# Patient Record
Sex: Female | Born: 1985 | Race: White | Hispanic: No | Marital: Single | Smoking: Never smoker
Health system: Southern US, Community
[De-identification: ages and names within clinical notes are randomized; demographics above are authoritative.]

## PROBLEM LIST (undated history)

## (undated) DIAGNOSIS — F32A Depression, unspecified: Secondary | ICD-10-CM

## (undated) DIAGNOSIS — R87629 Unspecified abnormal cytological findings in specimens from vagina: Secondary | ICD-10-CM

## (undated) DIAGNOSIS — N83209 Unspecified ovarian cyst, unspecified side: Secondary | ICD-10-CM

## (undated) DIAGNOSIS — B977 Papillomavirus as the cause of diseases classified elsewhere: Secondary | ICD-10-CM

## (undated) DIAGNOSIS — E7800 Pure hypercholesterolemia, unspecified: Secondary | ICD-10-CM

## (undated) DIAGNOSIS — I509 Heart failure, unspecified: Secondary | ICD-10-CM

## (undated) DIAGNOSIS — F319 Bipolar disorder, unspecified: Secondary | ICD-10-CM

## (undated) DIAGNOSIS — E119 Type 2 diabetes mellitus without complications: Secondary | ICD-10-CM

## (undated) DIAGNOSIS — F329 Major depressive disorder, single episode, unspecified: Secondary | ICD-10-CM

## (undated) DIAGNOSIS — K219 Gastro-esophageal reflux disease without esophagitis: Secondary | ICD-10-CM

## (undated) DIAGNOSIS — F419 Anxiety disorder, unspecified: Secondary | ICD-10-CM

## (undated) DIAGNOSIS — I10 Essential (primary) hypertension: Secondary | ICD-10-CM

## (undated) HISTORY — DX: Unspecified abnormal cytological findings in specimens from vagina: R87.629

## (undated) HISTORY — PX: SALPINGECTOMY: SHX328

## (undated) HISTORY — DX: Unspecified ovarian cyst, unspecified side: N83.209

## (undated) HISTORY — DX: Pure hypercholesterolemia, unspecified: E78.00

## (undated) HISTORY — DX: Heart failure, unspecified: I50.9

## (undated) HISTORY — DX: Papillomavirus as the cause of diseases classified elsewhere: B97.7

## (undated) HISTORY — PX: CHOLECYSTECTOMY: SHX55

---

## 2003-09-06 ENCOUNTER — Encounter: Payer: Self-pay | Admitting: Obstetrics & Gynecology

## 2003-09-06 ENCOUNTER — Encounter: Payer: Self-pay | Admitting: Emergency Medicine

## 2003-09-06 ENCOUNTER — Inpatient Hospital Stay (HOSPITAL_COMMUNITY): Admission: AD | Admit: 2003-09-06 | Discharge: 2003-09-17 | Payer: Self-pay | Admitting: Obstetrics & Gynecology

## 2003-09-11 ENCOUNTER — Encounter: Payer: Self-pay | Admitting: Obstetrics and Gynecology

## 2003-10-04 ENCOUNTER — Inpatient Hospital Stay (HOSPITAL_COMMUNITY): Admission: AD | Admit: 2003-10-04 | Discharge: 2003-10-08 | Payer: Self-pay | Admitting: Obstetrics & Gynecology

## 2009-01-24 ENCOUNTER — Emergency Department (HOSPITAL_COMMUNITY): Admission: EM | Admit: 2009-01-24 | Discharge: 2009-01-24 | Payer: Self-pay | Admitting: Emergency Medicine

## 2011-04-24 NOTE — H&P (Signed)
NAMEMarland Friedman  [name], [name]                           ACCOUNT NO.:  [id number]   MEDICAL RECORD NO.:  [id number]                   PATIENT TYPE:  EMS   LOCATION:  MAJO                                 FACILITY:  [facility]   PHYSICIAN:  Abigail Miyamoto, M.D.              DATE OF BIRTH:  October 15, 1986   DATE OF ADMISSION:  09/06/2003  DATE OF DISCHARGE:                                HISTORY & PHYSICAL   CHIEF COMPLAINT:  Abdominal pain, known gallstones, seven months pregnancy.   HISTORY OF PRESENT ILLNESS:  This is an 25 year old female who presents to  the emergency department down here with a several day history of right upper  quadrant abdominal pain.  She has been seen at [facility] in [place],  and was already known to be seven months pregnant.  She was found there to  have impacted gallstone, and was placed on antibiotics and pain medication.  Without improvement, the family decided to forego further care there and  come to [facility] for care.  Therefore, she presented to [facility].  She reports she has for almost a week had right upper quadrant  abdominal pain, nausea, but no emesis.  She denies any jaundice with this.  She had no chest pain or shortness of breath, and questionable fevers.  She  has only been recently found to be seven months pregnant.  She has had no  relief with the course of antibiotics she got at [facility], and with  Percocet.   PAST MEDICAL HISTORY:  Negative.   PAST SURGICAL HISTORY:  Tonsillectomy.   MEDICATIONS:  None.   ALLERGIES:  No known drug allergies.   SOCIAL HISTORY:  She does not smoke, does not drink alcohol.   PHYSICAL EXAMINATION:  GENERAL:  A well-developed, well-nourished female in  no acute distress.  VITAL SIGNS:  Blood pressure is 130/87, temperature 98.7, respiratory rate  18, heart rate 107.  HEENT:  Eyes - she is anicteric.  Pupils are reactive bilaterally.  Ears,  nose, mouth, and throat - external ears and  nose are normal.  Oropharynx is  clear.  NECK:  Supple.  There is no cervical adenopathy or thyromegaly.  LUNGS:  Clear to auscultation bilaterally with normal effort.  CARDIOVASCULAR:  Regular rate and rhythm with no murmurs.  There is no  peripheral edema.  ABDOMEN:  Gravid.  There is mild tenderness with guarding in the right upper  quadrant.  There are no hernias.  There is no organomegaly.  EXTREMITIES:  Warm and well perfused.   LABORATORY DATA:  The patient has a normal bilirubin at 0.9, normal alkaline  phosphatase at 72, and the rest of her liver function tests are normal.  Amylase and lipase are normal.  White blood cell count is 11.2 with 63%  neutrophils.  Hemoglobin is 11.2.   X-RAY DATA:  The patient has an ultrasound showing her to  have impacted  gallstone in the neck, and a mildly thick wall.  There is no free fluid.  No  pericholecystic fluid, and a normal-sized common bile duct.   IMPRESSION:  The patient with symptomatic cholelithiasis, possible mild  cholecystitis, and impacted gallstone, who is also seven months pregnant.  At this point, she would not be able to undergo laparoscopic  cholecystectomy, given the duration of her pregnancy.  All I can offer right  now would be open cholecystectomy or percutaneous cholecystostomy.  At this  point, we will admit her to the hospital, place her on IV antibiotics, and  try conservative management to get over this episode to see if we can indeed  proceed with her eventual delivery and then laparoscopic cholecystectomy  after this.  If she does worsen, we will proceed with open cholecystectomy.  We will admit her to [facility], and will have the OB/GYN unassigned  on call evaluate Ms. Licea.  I discussed this with Dr. Seymour Bars, on call for  [facility].  She will be transferred from [facility] to [facility].                                               Abigail Miyamoto, M.D.    DB/MEDQ  D:  09/06/2003  T:  09/06/2003   Job:  [id number]

## 2011-04-24 NOTE — Discharge Summary (Signed)
NAMETHATIANA, [name]                           ACCOUNT NO.:  [id number]   MEDICAL RECORD NO.:  [id number]                   PATIENT TYPE:  INP   LOCATION:  9105                                 [facility]ACILITY:  [facility]   PHYSICIAN:  Franchot Mimes, MD                   DATE OF BIRTH:  [birth year]   DATE OF ADMISSION:  09/06/2003  DATE OF DISCHARGE:  09/17/2003                                 DISCHARGE SUMMARY   DISCHARGE DIAGNOSES:  1. Intrauterine pregnancy.  2. Symptomatic cholelithiasis.   HISTORY OF PRESENT ILLNESS:  [name] is an 25 year old, G1, at 35 weeks and  6 days at the time of discharge, who was transferred to the [facility] teaching  service on September 12, 2003.  Please refer to prior history and physical for  details of hospital course prior to September 12, 2003.   HOSPITAL COURSE:  She was admitted for severe right upper quadrant pain,  nausea, and vomiting secondary to impacted gallstones that were revealed by  ultrasound at the time of admission.  At the time when care was undertaken  by the teaching service, the patient was tolerating a regular diet.  Her  pain was well controlled on Dilaudid 1 mg p.o. q.3h. and she was on Unasyn 3  g IV q.6h. for antibiotics.   During the remainder of the hospital course, the patient remained afebrile  and tolerated p.o. without complication.  Her pain medicine was switched to  Percocet 5 mg q.4-6h. because she felt the Dilaudid was not adequate for  pain control and her antibiotics were switched to Augmentin 875 mg prior to  discharge.   The patient received betamethasone injections x 2 on her admission.   Given that the patient was on entirely p.o. medicine, was not receiving any  IV fluids, and was tolerating a regular diet without complication, it was  determined that she was okay for discharge home.  I discussed this with the  surgical consult, who agreed.  The patient will be scheduled for followup  appointment in one week.   DISCHARGE  MEDICATIONS:  1. Ibuprofen 600 mg every six to eight hours for moderate pain.  2. Percocet 5/325 mg every four to six hours for severe pain.  3. Colace 100 mg p.o. b.i.d. for constipation.  4. Niferex 150 mg p.o. b.i.d.  5. Augmentin 875 mg p.o. b.i.d. for seven days.  6. Ambien 10 mg q.h.s. p.r.n. insomnia.  7. Prenatal vitamins one tablet per day.   DISCHARGE DESTINATION:  The patient was discharged home.   DIET:  The patient was instructed to eat a low-fat diet.   ACTIVITY:  To restrict her activity with no heavy lifting and no strenuous  activity and to not have any intercourse or things that would make her  organism.    FOLLOWUP APPOINTMENTS:  1. The patient is scheduled for a followup appointment  at the [facility] on Tuesday, September 25, 2003.  She will be called for     the time of the appointment.  2. The patient was given the number for Dr. Eliberto Ivory office at the time of     discharge.  She is to call if she needs an appointment before two weeks,     otherwise she is to call to schedule an appointment in approximately two     weeks for followup.                                               Franchot Mimes, MD    TV/MEDQ  D:  09/17/2003  T:  09/17/2003  Job:  [id number]   cc:   Abigail Miyamoto, M.D.  [address]  [place]  [place] [address]  Fax: [phone]

## 2011-04-24 NOTE — Consult Note (Signed)
   [name], [name]                           ACCOUNT NO.:  [id number]   MEDICAL RECORD NO.:  [id number]                   PATIENT TYPE:  INP   LOCATION:  9373                                 [facility]ILITY[facility]   PHYSICIAN:  Lenoard Aden, M.D.             DATE OF BIRTH:  07-08-1986   DATE OF CONSULTATION:  09/06/2003  DATE OF DISCHARGE:                                   CONSULTATION   CHIEF COMPLAINT:  Abdominal pain.   HISTORY OF PRESENT ILLNESS:  The patient is an 25 year old female at 45  weeks estimated gestational age who presents with impacted gallstones and  possible acute cholecystitis.  She presented to [facility], and was  evaluated by surgical, Dr. Magnus Ivan, and was sent to [facility] for  surveillance pending a surgical evaluation and triage.  She denies emesis,  shortness of breath, or any evidence of jaundice.  She has had no history of  previous abdominal pain.   PAST MEDICAL HISTORY:  Noncontributory.   SOCIAL HISTORY:  Noncontributory.   MEDICAL HISTORY:  Remarkable for tonsillectomy.   MEDICATIONS:  Takes no medications.   ALLERGIES:  No known drug allergies.   SOCIAL HISTORY:  Denies domestic or physical violence.   PHYSICAL EXAMINATION:  GENERAL:  She is a well-developed, well-nourished,  white female in no acute distress.  VITAL SIGNS:  Blood pressure 130/87, temperature 96, respirations 18, pulse  100.  HEENT:  Normal.  LUNGS:  Clear.  HEART:  Regular rhythm.  ABDOMEN:  Soft.  Mildly tender to palpation.  No rebound or guarding noted.  EXTREMITIES:  No __________.  NEUROLOGIC:  Nonfocal.  PELVIC:  Deferred.   LABORATORY DATA:  WBC of 11.2, hemoglobin 11.2.  Liver function tests within  normal limits.  Ultrasound, AFI, and BPP are pending.  Fetal heart rate in  the 140 to 150 beat per minute range with fetal accelerations noted.  No  decelerations noted.  Contractions not monitored at this time.   IMPRESSION:  Acute cholecystitis at 31  weeks intrauterine gestation.    PLAN:  Expectant versus surgical management to be determined by general  surgery.  Will monitor with NST's b.i.d.  Check ultrasound, AFI, and  estimated fetal weight.  Will triage surveillance pending ultrasound  results.  Will then, pending surgical management, follow up per obstetric  care in [facility] per patient request.                                               Lenoard Aden, M.D.    RJT/MEDQ  D:  09/06/2003  T:  09/06/2003  Job:  [id number]

## 2017-05-23 ENCOUNTER — Inpatient Hospital Stay (HOSPITAL_COMMUNITY)
Admission: AD | Admit: 2017-05-23 | Discharge: 2017-05-27 | DRG: 885 | Disposition: A | Discharge status: Home / Self Care | Payer: Medicaid Other | Source: Intra-hospital | Attending: Psychiatry | Admitting: Psychiatry

## 2017-05-23 ENCOUNTER — Encounter (HOSPITAL_COMMUNITY): Payer: Self-pay

## 2017-05-23 DIAGNOSIS — R45851 Suicidal ideations: Secondary | ICD-10-CM | POA: Diagnosis not present

## 2017-05-23 DIAGNOSIS — F332 Major depressive disorder, recurrent severe without psychotic features: Secondary | ICD-10-CM

## 2017-05-23 DIAGNOSIS — K219 Gastro-esophageal reflux disease without esophagitis: Secondary | ICD-10-CM | POA: Diagnosis not present

## 2017-05-23 DIAGNOSIS — I10 Essential (primary) hypertension: Secondary | ICD-10-CM | POA: Diagnosis not present

## 2017-05-23 DIAGNOSIS — E1165 Type 2 diabetes mellitus with hyperglycemia: Secondary | ICD-10-CM | POA: Diagnosis not present

## 2017-05-23 DIAGNOSIS — Z818 Family history of other mental and behavioral disorders: Secondary | ICD-10-CM | POA: Diagnosis not present

## 2017-05-23 DIAGNOSIS — R002 Palpitations: Secondary | ICD-10-CM | POA: Diagnosis not present

## 2017-05-23 DIAGNOSIS — Z91013 Allergy to seafood: Secondary | ICD-10-CM

## 2017-05-23 HISTORY — DX: Essential (primary) hypertension: I10

## 2017-05-23 HISTORY — DX: Depression, unspecified: F32.A

## 2017-05-23 HISTORY — DX: Major depressive disorder, single episode, unspecified: F32.9

## 2017-05-23 HISTORY — DX: Anxiety disorder, unspecified: F41.9

## 2017-05-23 HISTORY — DX: Bipolar disorder, unspecified: F31.9

## 2017-05-23 HISTORY — DX: Gastro-esophageal reflux disease without esophagitis: K21.9

## 2017-05-23 HISTORY — DX: Major depressive disorder, recurrent severe without psychotic features: F33.2

## 2017-05-23 HISTORY — DX: Type 2 diabetes mellitus without complications: E11.9

## 2017-05-23 LAB — GLUCOSE, CAPILLARY: Glucose-Capillary: 208 mg/dL — ABNORMAL HIGH (ref 65–99)

## 2017-05-23 MED ORDER — ACETAMINOPHEN 325 MG PO TABS: 650.0000 mg | ORAL_TABLET | Freq: Four times a day (QID) | ORAL | Status: DC | PRN

## 2017-05-23 MED ORDER — MAGNESIUM HYDROXIDE 400 MG/5ML PO SUSP: 30.0000 mL | Freq: Every day | ORAL | Status: DC | PRN

## 2017-05-23 MED ORDER — INSULIN ASPART 100 UNIT/ML ~~LOC~~ SOLN
0.0000 [IU] | Freq: Three times a day (TID) | SUBCUTANEOUS | Status: DC
  Administered 2017-05-24: 3 [IU] via SUBCUTANEOUS
  Administered 2017-05-24 (×2): 5 [IU] via SUBCUTANEOUS

## 2017-05-23 MED ORDER — ALUM & MAG HYDROXIDE-SIMETH 200-200-20 MG/5ML PO SUSP: 30.0000 mL | ORAL | Status: DC | PRN

## 2017-05-23 MED ORDER — NYSTATIN 100000 UNIT/GM EX POWD
Freq: Two times a day (BID) | CUTANEOUS | Status: DC
  Filled 2017-05-23: qty 15

## 2017-05-23 NOTE — Tx Team (Signed)
Initial Treatment Plan 05/23/2017 11:35 PM Amy SeveranceKristina Patricia Friedman UJW:[id number]    PATIENT STRESSORS: Health problems Medication change or noncompliance   PATIENT STRENGTHS: Ability for insight Active sense of humor Average or above average intelligence Capable of independent living Motivation for treatment/growth Supportive family/friends   PATIENT IDENTIFIED PROBLEMS:   "Getting back on my medications that worked"  "Resolve thoughts of suicide"  "Have an out-patient plan in place that is reliable"  Suicide Risk             DISCHARGE CRITERIA:  Ability to meet basic life and health needs Adequate post-discharge living arrangements Improved stabilization in mood, thinking, and/or behavior Medical problems require only outpatient monitoring Motivation to continue treatment in Friedman less acute level of care Need for constant or close observation no longer present Reduction of life-threatening or endangering symptoms to within safe limits Safe-care adequate arrangements made Verbal commitment to aftercare and medication compliance  PRELIMINARY DISCHARGE PLAN: Outpatient therapy  PATIENT/FAMILY INVOLVEMENT: This treatment plan has been presented to and reviewed with the patient, [name].  The patient and family have been given the opportunity to ask questions and make suggestions.  Amy LoganAmanda Friedman Amy Hughett, RN 05/23/2017, 11:35 PM

## 2017-05-23 NOTE — BH Assessment (Signed)
Tele Assessment Note Pt was assessed by TTS [name] [facility] 05/22/17:  Pt reports she was diagnosed with depressive symptoms since childhood. She states that for 15 years her psychiatrist prescribed Prozac 80 mg and she was relatively stable. She says her psychiatrist retired and three months ago her new psychiatrist is prescribing a low dose Prozac, gabapentin and hydroxyzine. Pt says she feels sad, angry and emotionally unstable. She acknowledges symptoms including crying spells, social withdrawal, loss of interest in usual pleasures, fatigue, anhedonia, irritability and feelings of hopelessness. Pt reports she is sleeping very little and her appetite is poor. She reports having increased anxiety and daily panic attacks. She says she has not been caring for her grooming as she typically does. She reports recurring suicidal ideation with thoughts of overdosing on medications. Pt reports she has threatened to overdose and has cut herself when she was younger. Pt reports her paternal grandmother and aunt both completed suicide. She says she has been trying to rationalize that her children would be better off without her. She says she is afraid to be alone because she fears she will act on suicidal thought. Pt says she doesn't feel safe returning home at this time. Pt denies current homicidal ideation or history of aggressive behavior. She denies any history of psychotic symptoms. Pt denies any history of alcohol or substance use.  Pt identifies family conflicts as her primary stressor. She says her daughter has been diagnosed with ADHD and ODD. Pt reports she is on disability. She lives with her daughter, her boyfriend and her boyfriend's mother. Pt identifies her boyfriend and mother as primary supports. She says her mother noticed how depressed Pt is and insisted she seek treatment. Pt reports both her parents were diagnosed with bipolar disorder and there is a maternal history of alcohol abuse. Pt denies any  legal problems.  Pt reports she is receiving outpatient mental health treatment through [facility]. She says she is compliant with medications. Pt reports she was psychiatrically hospitalized several times as a child at [facility].   Pt is dressed in a hospital gown. She is alert, oriented x4 with normal speech and motor behavior. Eye contact is good. Pt's mood is depressed and anxious; affect is congruent with mood. Pt's thought process is coherent and relevant. Pt was cooperative throughout assessment. Pt is willing to sign voluntarily into a psychiatric facility.   [name] is an 31 y.o. female.   Diagnosis: Bipolar I Disorder, Current Episode Depressed, Severe without Psychotic Features  Past Medical History: No past medical history on file.  No past surgical history on file.  Family History: No family history on file.  Social History:  has no tobacco, alcohol, and drug history on file.  Additional Social History:  Alcohol / Drug Use Pain Medications: no abuse =- see pta meds list Prescriptions: no abuse - see pta meds list Over the Counter: no abuse - see pta meds list History of alcohol / drug use?: No history of alcohol / drug abuse  CIWA:   COWS:    PATIENT STRENGTHS: (choose at least two) Average or above average intelligence Communication skills Physical Health  Allergies: Allergies no known allergies  Home Medications:  (Not in a hospital admission)  OB/GYN Status:  No LMP recorded.  General Assessment Data Location of Assessment: [facility] TTS Assessment: Out of system Is this a Tele or Face-to-Face Assessment?: Tele Assessment Is this an Initial Assessment or a Re-assessment for this encounter?: Initial Assessment  Marital status: Long term relationship Is patient pregnant?: No Pregnancy Status: No Living Arrangements: Children, Non-relatives/Friends (daughter, pt's boyfriend & boyfriend's mom) Can pt return to current living  arrangement?: Yes Admission Status: Voluntary Is patient capable of signing voluntary admission?: Yes Referral Source: Self/Family/Friend Insurance type[place] [place]     Crisis Care Plan Living Arrangements: Children, Non-relatives/Friends (daughter, pt's boyfriend & boyfriend's mom) Name of Psychiatrist: [facility] Name of Therapist: [facility]  Education Status Is patient currently in school?: No  Risk to self with the past 6 months Suicidal Ideation: Yes-Currently Present Has patient been a risk to self within the past 6 months prior to admission? : No Suicidal Intent: No Has patient had any suicidal intent within the past 6 months prior to admission? : No Is patient at risk for suicide?: Yes Suicidal Plan?: Yes-Currently Present Has patient had any suicidal plan within the past 6 months prior to admission? : No Specify Current Suicidal Plan: overdose on her meds Access to Means: Yes What has been your use of drugs/alcohol within the last 12 months?: none Previous Attempts/Gestures:  (pt threatened suicide several times when a child) Other Self Harm Risks: none Family Suicide History: Yes (paternal grandmom & aunt completed suicides) Recent stressful life event(s):  (conflict w/ family, daughter has ADHD & ODD, on disability) Persecutory voices/beliefs?: No Depression: Yes Depression Symptoms: Isolating, Fatigue, Tearfulness, Loss of interest in usual pleasures, Despondent, Feeling angry/irritable Substance abuse history and/or treatment for substance abuse?: No Suicide prevention information given to non-admitted patients: Not applicable  Risk to Others within the past 6 months Homicidal Ideation: No Does patient have any lifetime risk of violence toward others beyond the six months prior to admission? : No Thoughts of Harm to Others: No Current Homicidal Intent: No Current Homicidal Plan: No Access to Homicidal Means: No Identified Victim: n/a History of harm to  others?: No Assessment of Violence: None Noted Violent Behavior Description: pt denies hx violence Does patient have access to weapons?: No Criminal Charges Pending?: No Does patient have a court date: No Is patient on probation?: No  Psychosis Hallucinations: None noted Delusions: None noted  Mental Status Report Appearance/Hygiene: Unremarkable Eye Contact: Good Motor Activity: Freedom of movement, Unremarkable Speech: Logical/coherent Level of Consciousness: Alert Mood: Depressed, Sad Affect: Appropriate to circumstance, Sad, Depressed Anxiety Level: Panic Attacks Thought Processes: Relevant, Coherent Judgement: Unimpaired Orientation: Person, Place, Time, Situation Obsessive Compulsive Thoughts/Behaviors: None  Cognitive Functioning Concentration: Normal Memory: Recent Intact, Remote Intact IQ: Average Insight: Good Impulse Control: Fair Appetite: Fair Vegetative Symptoms: Decreased grooming  ADLScreening ([facility] Assessment Services) Patient's cognitive ability adequate to safely complete daily activities?: Yes Patient able to express need for assistance with ADLs?: Yes Independently performs ADLs?: Yes (appropriate for developmental age)  Prior Inpatient Therapy Prior Inpatient Therapy: Yes Prior Therapy Dates: as a child Prior Therapy Facilty/Provider(s): [facility]  Prior Outpatient Therapy Prior Outpatient Therapy: Yes Prior Therapy Dates: currently' Prior Therapy Facilty/Provider(s): [facility] Reason for Treatment: med management Does patient have an ACCT team?: No Does patient have Intensive In-House Services?  : No Does patient have [facility] services? : No Does patient have P4CC services?: Unknown  ADL Screening (condition at time of admission) Patient's cognitive ability adequate to safely complete daily activities?: Yes Is the patient deaf or have difficulty hearing?: No Does the patient have difficulty seeing, even when wearing glasses/contacts?: No Does  the patient have difficulty concentrating, remembering, or making decisions?: No Patient able to express need for assistance with ADLs?: Yes Does the  patient have difficulty dressing or bathing?: No Independently performs ADLs?: Yes (appropriate for developmental age) Does the patient have difficulty walking or climbing stairs?: No Weakness of Legs: None Weakness of Arms/Hands: None  Home Assistive Devices/Equipment Home Assistive Devices/Equipment: None    Abuse/Neglect Assessment (Assessment to be complete while patient is alone) Physical Abuse: Denies Verbal Abuse: Denies Sexual Abuse: Denies Exploitation of patient/patient's resources: Denies Self-Neglect: Denies     Merchant navy officer (For Healthcare) Does Patient Have a Medical Advance Directive?: No Would patient like information on creating a medical advance directive?: No - Patient declined    Additional Information 1:1 In Past 12 Months?: No CIRT Risk: No Elopement Risk: No Does patient have medical clearance?: Yes     Disposition:  Disposition Initial Assessment Completed for this Encounter: Yes Disposition of Patient: Inpatient treatment program Type of inpatient treatment program: Adult (jason berry NP accepts pt to [facility] 403-1)  Megha Agnes P 05/23/2017 5:19 PM
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
## 2017-05-24 ENCOUNTER — Other Ambulatory Visit: Payer: Self-pay

## 2017-05-24 DIAGNOSIS — Z818 Family history of other mental and behavioral disorders: Secondary | ICD-10-CM

## 2017-05-24 DIAGNOSIS — R45851 Suicidal ideations: Secondary | ICD-10-CM

## 2017-05-24 DIAGNOSIS — R002 Palpitations: Secondary | ICD-10-CM

## 2017-05-24 LAB — LIPID PANEL
Cholesterol: 156 mg/dL (ref 0–200)
HDL: 31 mg/dL — ABNORMAL LOW (ref >40)
LDL Cholesterol: UNDETERMINED mg/dL (ref 0–99)
Total CHOL/HDL Ratio: 5 RATIO
Triglycerides: 408 mg/dL — ABNORMAL HIGH (ref <150)
VLDL: UNDETERMINED mg/dL (ref 0–40)

## 2017-05-24 LAB — GLUCOSE, CAPILLARY
Glucose-Capillary: 215 mg/dL — ABNORMAL HIGH (ref 65–99)
Glucose-Capillary: 186 mg/dL — ABNORMAL HIGH (ref 65–99)
Glucose-Capillary: 225 mg/dL — ABNORMAL HIGH (ref 65–99)
Glucose-Capillary: 278 mg/dL — ABNORMAL HIGH (ref 65–99)

## 2017-05-24 LAB — TSH: TSH: 2.019 u[IU]/mL (ref 0.350–4.500)

## 2017-05-24 MED ORDER — FLUOXETINE HCL 20 MG PO CAPS
20.0000 mg | ORAL_CAPSULE | Freq: Every day | ORAL | Status: DC
Start: 2017-05-24 — End: 2017-05-25
  Administered 2017-05-24 – 2017-05-25 (×2): 20 mg via ORAL
  Filled 2017-05-24 (×5): qty 1

## 2017-05-24 MED ORDER — LISINOPRIL 2.5 MG PO TABS
2.5000 mg | ORAL_TABLET | Freq: Every day | ORAL | Status: DC
  Administered 2017-05-24 – 2017-05-27 (×4): 2.5 mg via ORAL
  Filled 2017-05-24 (×5): qty 1

## 2017-05-24 MED ORDER — METOPROLOL TARTRATE 25 MG PO TABS
25.0000 mg | ORAL_TABLET | Freq: Two times a day (BID) | ORAL | Status: DC
  Administered 2017-05-24 – 2017-05-27 (×6): 25 mg via ORAL
  Filled 2017-05-24 (×10): qty 1

## 2017-05-24 MED ORDER — INSULIN ASPART 100 UNIT/ML ~~LOC~~ SOLN
0.0000 [IU] | Freq: Every day | SUBCUTANEOUS | Status: DC
  Administered 2017-05-25 – 2017-05-26 (×2): 2 [IU] via SUBCUTANEOUS

## 2017-05-24 MED ORDER — INSULIN ASPART 100 UNIT/ML ~~LOC~~ SOLN
0.0000 [IU] | Freq: Three times a day (TID) | SUBCUTANEOUS | Status: DC
  Administered 2017-05-25 (×3): 11 [IU] via SUBCUTANEOUS
  Administered 2017-05-26: 4 [IU] via SUBCUTANEOUS
  Administered 2017-05-26: 7 [IU] via SUBCUTANEOUS
  Administered 2017-05-26: 11 [IU] via SUBCUTANEOUS
  Administered 2017-05-27: 7 [IU] via SUBCUTANEOUS

## 2017-05-24 MED ORDER — LAMOTRIGINE 25 MG PO TABS
25.0000 mg | ORAL_TABLET | Freq: Every day | ORAL | Status: DC
  Administered 2017-05-24 – 2017-05-27 (×4): 25 mg via ORAL
  Filled 2017-05-24 (×6): qty 1

## 2017-05-24 MED ORDER — LORAZEPAM 0.5 MG PO TABS
0.5000 mg | ORAL_TABLET | Freq: Four times a day (QID) | ORAL | Status: DC | PRN
  Administered 2017-05-24 – 2017-05-26 (×2): 0.5 mg via ORAL
  Filled 2017-05-24 (×2): qty 1

## 2017-05-24 MED ORDER — INSULIN GLARGINE 100 UNIT/ML ~~LOC~~ SOLN
6.0000 [IU] | Freq: Every day | SUBCUTANEOUS | Status: DC
  Administered 2017-05-24 – 2017-05-25 (×2): 6 [IU] via SUBCUTANEOUS

## 2017-05-24 MED ORDER — PANTOPRAZOLE SODIUM 40 MG PO TBEC
80.0000 mg | DELAYED_RELEASE_TABLET | Freq: Every day | ORAL | Status: DC
  Administered 2017-05-24 – 2017-05-27 (×4): 80 mg via ORAL
  Filled 2017-05-24 (×6): qty 2

## 2017-05-24 MED ORDER — LISINOPRIL 5 MG PO TABS
ORAL_TABLET | ORAL | Status: AC
  Filled 2017-05-24: qty 1

## 2017-05-24 MED ORDER — NYSTATIN 100000 UNIT/GM EX POWD
Freq: Every day | CUTANEOUS | Status: DC
  Administered 2017-05-25: 22:00:00 via TOPICAL
  Filled 2017-05-24: qty 15

## 2017-05-24 NOTE — [facility] Suicide Risk Assessment (Signed)
[facility]   Nursing information obtained from:   patient and chart  Demographic factors:   31 year old single female, lives with BF, has two children  Current Mental Status:   see below Loss Factors:   recent change in medications after her long time psychiatrist retired. Disability Historical Factors:   depression, anxiety Risk Reduction Factors:   resilience, sense of responsibility to her family   Total Time spent with patient: 45 minutes Principal Problem: MDD, Severe, without psychotic features    Diagnosis:   Patient Active Problem List   Diagnosis Date Noted  . Severe recurrent major depression without psychotic features (HCC) [F33.2] 05/23/2017    Continued Clinical Symptoms:  Alcohol Use Disorder Identification Test Final Score (AUDIT): 2 The "Alcohol Use Disorders Identification Test", Guidelines for Use in Primary Care, Second Edition.  World Science writerHealth Organization [facility](WHO). Score between 0-7:  no or low risk or alcohol related problems. Score between 8-15:  moderate risk of alcohol related problems. Score between 16-19:  high risk of alcohol related problems. Score 20 or above:  warrants further diagnostic evaluation for alcohol dependence and treatment.   CLINICAL FACTORS:  31 year old female, presents for worsening depression and suicidal ideations, reports recent medication changes made by her new psychiatrist not effective. States she stopped all her psychiatric medications about one month ago. Reports history of good response to Prozac at high doses .    Psychiatric Specialty Exam: Physical Exam  ROS  Blood pressure 132/77, pulse (!) 118, temperature 99 F (37.2 C), temperature source Oral, resp. rate 16, height 5\' 4"  (1.626 m), weight (!) 197.9 kg (436 lb 3.2 oz), last menstrual period 04/22/2017, SpO2 99 %.Body mass index is 74.87 kg/m.   see admit note MSE     COGNITIVE FEATURES THAT CONTRIBUTE TO RISK:  Closed-mindedness, Loss of  executive function and Polarized thinking    SUICIDE RISK:   Moderate:  Frequent suicidal ideation with limited intensity, and duration, some specificity in terms of plans, no associated intent, good self-control, limited dysphoria/symptomatology, some risk factors present, and identifiable protective factors, including available and accessible social support.  PLAN OF CARE: Patient will be admitted to inpatient psychiatric unit for stabilization and safety. Will provide and encourage milieu participation. Provide medication management and maked adjustments as needed.  Will follow daily.    I certify that inpatient services furnished can reasonably be expected to improve the patient's condition.   Craige CottaFernando A Serrina Minogue, MD 05/24/2017, 5:02 PM

## 2017-05-24 NOTE — [facility] Counselor (Signed)
CSW attempted to complete PSA with pt earlier today. Pt requested that CSW come at another time.   Jonathon JordanLynn B Karren Newland, MSW, Theresia MajorsLCSWA [phone]

## 2017-05-24 NOTE — Plan of Care (Signed)
Problem: Safety: Goal: Periods of time without injury will increase Outcome: Progressing Patient contracts for safety on the unit and is on q15 minute safety checks. High fall risk precautions in place and reviewed with patient; patient verbalized understanding.

## 2017-05-24 NOTE — Progress Notes (Signed)
Nursing Note 05/24/2017 [id number]  Data Reports sleeping poor without PRN sleep med.  Rates depression 1/10, hopelessness 1/10, and anxiety 3/10. Affect depressed.  Denies HI, SI, AVH.  Pulse elevated throughout day, MD aware.  Patient states she was on multiple diabetic and hypertension medicines before coming in.  RN reconciled medicines with patient's pharmacy and noted in chart, MD notified.   Last fill was beginning of April, 30 day supplies, patient admits to taking no medicines for a month.  MD aware of this too, RN suggested internal medicine consult as patient has been off meds for a while and BP isn't alarmingly high.  Patient on sliding scale insulin to cover blood sugars for now.  Attending some groups, minimal, depressed.  Action  Spoke with patient 1:1, nurse offered support to patient throughout shift.  MD to consult internal medicine.  Continues to be monitored on 15 minute checks for safety.   Response Remains safe on unit.

## 2017-05-24 NOTE — Progress Notes (Signed)
Nursing Progress Note 1900-0730  D) Patient presented severely anxious at start of shift. Patient requested medication for anxiety and was observed with a pained expression and limited ability to verbalize feelings with Clinical research associatewriter. Patient responded well to PRN ativan and had a notable improvement. Patient appeared more at ease and stated to writer, "I miss my kids and I think visitation was hard for me this evening". Patient with elevated BP and HR this evening but denies feeling symptomatic. CBG elevated. PA Spencer notified and patient medicated with new orders. HR and BP lower after receiving medication. Patient complained of GERD and difficulty sleeping in flat bed. Patient moved to hospital bed and writer and MHT elevated head of bed for patient. Patient reports improvement. Patient attended group and was more visible in the milieu. Patient reports passive SI but denies HI/AVH or pain. Patient contracts for safety on the unit.   A) Emotional support given. 1:1 interaction and active listening provided. Patient medicated as prescribed. Medications and plan of care reviewed with patient. Patient verbalized understanding without further questions. Snacks and fluids provided. Opportunities for questions or concerns presented to patient. Patient encouraged to continue to work on treatment goals. Labs, vital signs and patient behavior monitored throughout shift. Patient safety maintained with q15 min safety checks. High fall risk precautions in place and reviewed with patient; patient verbalized understanding. EKG performed; provider reviewed. Urine specimen collected and will be given to lab.  R) Patient receptive to interaction with nurse. Patient remains safe on the unit at this time. Patient denies any adverse medication reactions at this time. Patient is resting in bed without complaints. Will continue to monitor.

## 2017-05-24 NOTE — Progress Notes (Signed)
Recreation Therapy Notes  Date: 05/24/17 Time: 0930 Location: 300 Hall Group Room  Group Topic: Stress Management  Goal Area(s) Addresses:  Patient will verbalize importance of using healthy stress management.  Patient will identify positive emotions associated with healthy stress management.   Intervention: Stress Management  Activity :  Guided Imagery.  LRT introduced the stress management technique of guided imagery.  LRT read a script to allow patients to engage in the technique.  Patients were to follow along as the script was read to fully participate.  Education: Stress Management, Discharge Planning.   Education Outcome: Acknowledges edcuation/In group clarification offered/Needs additional education  Clinical Observations/Feedback: Pt did not attend group.   Whittany Parish, LRT/CTRS        Kayhan Boardley A 05/24/2017 2:33 PM 

## 2017-05-24 NOTE — Plan of Care (Signed)
Problem: Education: Goal: Emotional status will improve Outcome: Progressing Patient reports a decrease in anxiety after taking PRN anti-anxiety medications and 1:1 therapeutic communication with Clinical research associatewriter.

## 2017-05-24 NOTE — [facility] Group Notes (Signed)
[facility] LCSW Group Therapy  05/24/2017 1:15pm  Type of Therapy: Group Therapy   Topic: Overcoming Obstacles  Participation Level: Pt was present for the duration of the group. Pt did not participate in the discussion but listened attentively throughout.   Mical Brun B Ailis Rigaud, MSW, LCSWA [phone]  
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
## 2017-05-24 NOTE — Progress Notes (Signed)
Patient attended group and said that her day was a 3.  Her coping skills for today were watch TV, and socializing.

## 2017-05-24 NOTE — Tx Team (Signed)
Interdisciplinary Treatment and Diagnostic Plan Update 05/24/2017 Time of Session: 9:30am  [name]  MRN: [id number]  Principal Diagnosis: MDD without psychotic symptoms  Secondary Diagnoses: Active Problems:   Severe recurrent major depression without psychotic features (HCC)   Current Medications:  Current Facility-Administered Medications  Medication Dose Route Frequency Provider Last Rate Last Dose  . acetaminophen (TYLENOL) tablet 650 mg  650 mg Oral Q6H PRN Nira Conn A, NP      . alum & mag hydroxide-simeth (MAALOX/MYLANTA) 200-200-20 MG/5ML suspension 30 mL  30 mL Oral Q4H PRN Nira Conn A, NP      . insulin aspart (novoLOG) injection 0-15 Units  0-15 Units Subcutaneous TID WC Jackelyn Poling, NP   3 Units at 05/24/17 1211  . magnesium hydroxide (MILK OF MAGNESIA) suspension 30 mL  30 mL Oral Daily PRN Jackelyn Poling, NP      . Melene Muller ON 05/25/2017] nystatin (MYCOSTATIN/NYSTOP) topical powder   Topical QHS Oneta Rack, NP        PTA Medications: Prescriptions Prior to Admission  Medication Sig Dispense Refill Last Dose  . ibuprofen (ADVIL,MOTRIN) 200 MG tablet Take 400 mg by mouth every 6 (six) hours as needed for moderate pain.   Past Week at Unknown time  . Multiple Vitamin (MULTIVITAMIN WITH MINERALS) TABS tablet Take 1 tablet by mouth daily.   Past Month at Unknown time    Treatment Modalities: Medication Management, Group therapy, Case management,  1 to 1 session with clinician, Psychoeducation, Recreational therapy.  Patient Stressors: Health problems Medication change or noncompliance Patient Strengths: Ability for insight Active sense of humor Average or above average intelligence Capable of independent living Motivation for treatment/growth Supportive family/friends  Physician Treatment Plan for Primary Diagnosis: MDD without psychotic symptoms Long Term Goal(s): Improvement in symptoms so as ready for discharge Short Term Goals:     Medication Management: Evaluate patient's response, side effects, and tolerance of medication regimen.  Therapeutic Interventions: 1 to 1 sessions, Unit Group sessions and Medication administration.  Evaluation of Outcomes: Progressing  Physician Treatment Plan for Secondary Diagnosis: Active Problems:   Severe recurrent major depression without psychotic features (HCC)  Long Term Goal(s): Improvement in symptoms so as ready for discharge  Short Term Goals:    Medication Management: Evaluate patient's response, side effects, and tolerance of medication regimen.  Therapeutic Interventions: 1 to 1 sessions, Unit Group sessions and Medication administration.  Evaluation of Outcomes: Progressing  RN Treatment Plan for Primary Diagnosis: MDD without psychotic symptoms Long Term Goal(s): Knowledge of disease and therapeutic regimen to maintain health will improve  Short Term Goals: Compliance with prescribed medications will improve  Medication Management: RN will administer medications as ordered by provider, will assess and evaluate patient's response and provide education to patient for prescribed medication. RN will report any adverse and/or side effects to prescribing provider.  Therapeutic Interventions: 1 on 1 counseling sessions, Psychoeducation, Medication administration, Evaluate responses to treatment, Monitor vital signs and CBGs as ordered, Perform/monitor CIWA, COWS, AIMS and Fall Risk screenings as ordered, Perform wound care treatments as ordered.  Evaluation of Outcomes: Progressing  LCSW Treatment Plan for Primary Diagnosis: MDD without psychotic symptoms Long Term Goal(s): Safe transition to appropriate next level of care at discharge, Engage patient in therapeutic group addressing interpersonal concerns. Short Term Goals: Engage patient in aftercare planning with referrals and resources, Increase ability to appropriately verbalize feelings, Identify triggers  associated with mental health/substance abuse issues and Increase skills for wellness and  recovery  Therapeutic Interventions: Assess for all discharge needs, 1 to 1 time with Child psychotherapistocial worker, Explore available resources and support systems, Assess for adequacy in community support network, Educate family and significant other(s) on suicide prevention, Complete Psychosocial Assessment, Interpersonal group therapy.  Evaluation of Outcomes: Progressing  Progress in Treatment: Attending groups: Yes Participating in groups: Yes Taking medication as prescribed: Yes, MD continues to assess for medication changes as needed Toleration medication: Yes, no side effects reported at this time Family/Significant other contact made: No, CSW assessing for appropriate contact Patient understands diagnosis: Continuing to assess Discussing patient identified problems/goals with staff: Yes Medical problems stabilized or resolved: Yes Denies suicidal/homicidal ideation: Yes Issues/concerns per patient self-inventory: None Other: N/A  New problem(s) identified: None identified at this time.   New Short Term/Long Term Goal(s): None identified at this time.   Discharge Plan or Barriers: Pt wil; return home and follow up with an outpatient provider.  Reason for Continuation of Hospitalization:  Anxiety  Depression Medication stabilization Suicidal ideation   Estimated Length of Stay: 1-3 days; Estimated discharge date 6/22  Attendees: Patient: 05/24/2017 4:10 PM  Physician: Dr. Jama Flavorsobos 05/24/2017 4:10 PM  Nursing: Rayfield Citizenaroline, RN; Clydie BraunKaren, RN 05/24/2017 4:10 PM  RN Care Manager: Onnie BoerJennifer Clark, RN 05/24/2017 4:10 PM  Social Worker: Donnelly StagerLynn Boubacar Lerette, LCSWA 05/24/2017 4:10 PM  Recreational Therapist:  05/24/2017 4:10 PM  Other: Armandina StammerAgnes Nwoko, NP 05/24/2017 4:10 PM  Other:  05/24/2017 4:10 PM  Other: 05/24/2017 4:10 PM   Scribe for Treatment Team: Jonathon JordanLynn B Maleka Contino, MSW,LCSWA 05/24/2017 4:10 PM

## 2017-05-25 DIAGNOSIS — E1165 Type 2 diabetes mellitus with hyperglycemia: Secondary | ICD-10-CM

## 2017-05-25 DIAGNOSIS — I10 Essential (primary) hypertension: Secondary | ICD-10-CM

## 2017-05-25 LAB — TSH: TSH: 2.05 u[IU]/mL (ref 0.350–4.500)

## 2017-05-25 LAB — HEMOGLOBIN A1C
Hgb A1c MFr Bld: 9.3 % — ABNORMAL HIGH (ref 4.8–5.6)
Mean Plasma Glucose: 220 mg/dL

## 2017-05-25 LAB — GLUCOSE, CAPILLARY
Glucose-Capillary: 257 mg/dL — ABNORMAL HIGH (ref 65–99)
Glucose-Capillary: 268 mg/dL — ABNORMAL HIGH (ref 65–99)
Glucose-Capillary: 264 mg/dL — ABNORMAL HIGH (ref 65–99)
Glucose-Capillary: 210 mg/dL — ABNORMAL HIGH (ref 65–99)

## 2017-05-25 LAB — PREGNANCY, URINE: Preg Test, Ur: NEGATIVE

## 2017-05-25 LAB — PROLACTIN: Prolactin: 14 ng/mL (ref 4.8–23.3)

## 2017-05-25 MED ORDER — FLUOXETINE HCL 10 MG PO CAPS
30.0000 mg | ORAL_CAPSULE | Freq: Every day | ORAL | Status: DC
  Administered 2017-05-26: 30 mg via ORAL
  Filled 2017-05-25 (×2): qty 3

## 2017-05-25 MED ORDER — GLIMEPIRIDE 2 MG PO TABS
2.0000 mg | ORAL_TABLET | Freq: Every day | ORAL | Status: DC
  Administered 2017-05-26 – 2017-05-27 (×2): 2 mg via ORAL
  Filled 2017-05-25 (×4): qty 1

## 2017-05-25 MED ORDER — ATORVASTATIN CALCIUM 20 MG PO TABS
20.0000 mg | ORAL_TABLET | Freq: Every day | ORAL | Status: DC
  Administered 2017-05-25 – 2017-05-27 (×3): 20 mg via ORAL
  Filled 2017-05-25: qty 1
  Filled 2017-05-25: qty 2
  Filled 2017-05-25 (×3): qty 1
  Filled 2017-05-25: qty 2

## 2017-05-25 MED ORDER — PIOGLITAZONE HCL 15 MG PO TABS
15.0000 mg | ORAL_TABLET | Freq: Every day | ORAL | Status: DC
Start: 2017-05-25 — End: 2017-05-27
  Administered 2017-05-26 – 2017-05-27 (×2): 15 mg via ORAL
  Filled 2017-05-25 (×5): qty 1

## 2017-05-25 NOTE — Progress Notes (Signed)
Recreation Therapy Notes  Animal-Assisted Activity (AAA) Program Checklist/Progress Notes Patient Eligibility Criteria Checklist & Daily Group note for Rec TxIntervention  Date: 05/25/2017 Time: 2:55pm Location: 400 hall dayroom  AAA/T Program Assumption of Risk Form signed by Patient/ or Parent Legal Guardian Yes  Patient is free of allergies or sever asthma Yes  Patient reports no fear of animals Yes  Patient reports no history of cruelty to animals Yes  Patient understands his/her participation is voluntary Yes  Patient washes hands before animal contact Yes  Patient washes hands after animal contact Yes  Behavioral Response: engaged  Education:Hand Washing, Appropriate Animal Interaction   Education Outcome: Acknowledges education.   Clinical Observations/Feedback: Patient attended session and interacted appropriately with therapy dog and peers. Patient asked appropriate questions about therapy dog and his training. Patient shared stories about their pets at home with group.    Marvell Fullerachel Paul Torpey, Recreation Therapy Intern
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
## 2017-05-25 NOTE — Progress Notes (Signed)
[facility] MD Progress Note  05/25/2017 2:08 PM [name]  MRN:  [id number] Subjective:  Patient reports she continues to feel depressed, but acknowledges some improvement . She has had some passive SI, but denies any plan or intention of hurting self and contracts for safety on unit. She denies medication side effects. Objective : I have discussed case with treatment team and have met with patient . Patient has presented anxious, depressed, but is describing improvement since admission. No disruptive or agitated behaviors on unit. She denies suicidal plan or intentions . Contracts for safety on unit at this time. She denies medication side effects. As on admission, patient states that she had been stable for a long period of time on Prozac, and that she noticed a worsening in her symptoms after Prozac was decreased in dose and she was started on Neurontin. She had not been taking any psychiatric medications for about a month prior to admission. Affect remains vaguely anxious and constricted but improves as session progresses .  Principal Problem:  MDD  Diagnosis:   Patient Active Problem List   Diagnosis Date Noted  . Severe recurrent major depression without psychotic features ([place]) [F33.2] 05/23/2017   Total Time spent with patient: 20 minutes  Past Medical History:  Past Medical History:  Diagnosis Date  . Anxiety   . Bipolar disorder ([place])   . Depression   . Diabetes mellitus without complication ([place])   . GERD (gastroesophageal reflux disease)   . Hypertension     Past Surgical History:  Procedure Laterality Date  . CHOLECYSTECTOMY     Family History: History reviewed. No pertinent family history.  Social History:  History  Alcohol Use No     History  Drug Use No    Social History   Social History  . Marital status: Single    Spouse name: N/A  . Number of children: N/A  . Years of education: N/A   Social History Main Topics  . Smoking status: Never  Smoker  . Smokeless tobacco: Never Used  . Alcohol use No  . Drug use: No  . Sexual activity: No   Other Topics Concern  . None   Social History Narrative  . None   Additional Social History:    Pain Medications: no abuse =- see pta meds list Prescriptions: no abuse - see pta meds list Over the Counter: no abuse - see pta meds list History of alcohol / drug use?: No history of alcohol / drug abuse  Sleep: Good  Appetite:  Good  Current Medications: Current Facility-Administered Medications  Medication Dose Route Frequency Provider Last Rate Last Dose  . acetaminophen (TYLENOL) tablet 650 mg  650 mg Oral Q6H PRN Lindon Romp A, NP      . alum & mag hydroxide-simeth (MAALOX/MYLANTA) 200-200-20 MG/5ML suspension 30 mL  30 mL Oral Q4H PRN Lindon Romp A, NP      . FLUoxetine (PROZAC) capsule 20 mg  20 mg Oral Daily Cobos, Myer Peer, MD   20 mg at 05/25/17 0835  . insulin aspart (novoLOG) injection 0-20 Units  0-20 Units Subcutaneous TID WC Laverle Hobby, PA-C   11 Units at 05/25/17 1212  . insulin aspart (novoLOG) injection 0-5 Units  0-5 Units Subcutaneous QHS Simon, Spencer E, PA-C      . insulin glargine (LANTUS) injection 6 Units  6 Units Subcutaneous QHS Laverle Hobby, PA-C   6 Units at 05/24/17 2212  . lamoTRIgine (LAMICTAL) tablet 25 mg  25 mg Oral Daily Cobos, Myer Peer, MD   25 mg at 05/25/17 0835  . lisinopril (PRINIVIL,ZESTRIL) tablet 2.5 mg  2.5 mg Oral Daily Patriciaann Clan E, PA-C   2.5 mg at 05/25/17 0836  . LORazepam (ATIVAN) tablet 0.5 mg  0.5 mg Oral Q6H PRN Cobos, Myer Peer, MD   0.5 mg at 05/24/17 1953  . magnesium hydroxide (MILK OF MAGNESIA) suspension 30 mL  30 mL Oral Daily PRN Lindon Romp A, NP      . metoprolol tartrate (LOPRESSOR) tablet 25 mg  25 mg Oral BID Laverle Hobby, PA-C   25 mg at 05/25/17 0835  . nystatin (MYCOSTATIN/NYSTOP) topical powder   Topical QHS Derrill Center, NP      . pantoprazole (PROTONIX) EC tablet 80 mg  80 mg Oral  Daily Cobos, Myer Peer, MD   80 mg at 05/25/17 4098    Lab Results:  Results for orders placed or performed during the hospital encounter of 05/23/17 (from the past 48 hour(s))  Glucose, capillary     Status: Abnormal   Collection Time: 05/23/17  9:31 PM  Result Value Ref Range   Glucose-Capillary 208 (H) 65 - 99 mg/dL  Hemoglobin A1c     Status: Abnormal   Collection Time: 05/24/17  6:10 AM  Result Value Ref Range   Hgb A1c MFr Bld 9.3 (H) 4.8 - 5.6 %    Comment: (NOTE)         Pre-diabetes: 5.7 - 6.4         Diabetes: >6.4         Glycemic control for adults with diabetes: <7.0    Mean Plasma Glucose 220 mg/dL    Comment: (NOTE) Performed At: [facility] [place], [place] [id number] Lindon Romp MD FA:[id number] Performed at [facility], [place] [address], [place], [place] [address]   Lipid panel     Status: Abnormal   Collection Time: 05/24/17  6:10 AM  Result Value Ref Range   Cholesterol 156 0 - 200 mg/dL   Triglycerides 408 (H) <150 mg/dL   HDL 31 (L) >40 mg/dL   Total CHOL/HDL Ratio 5.0 RATIO   VLDL UNABLE TO CALCULATE IF TRIGLYCERIDE OVER 400 mg/dL 0 - 40 mg/dL   LDL Cholesterol UNABLE TO CALCULATE IF TRIGLYCERIDE OVER 400 mg/dL 0 - 99 mg/dL    Comment:        Total Cholesterol/HDL:CHD Risk Coronary Heart Disease Risk Table                     Men   Women  1/2 Average Risk   3.4   3.3  Average Risk       5.0   4.4  2 X Average Risk   9.6   7.1  3 X Average Risk  23.4   11.0        Use the calculated Patient Ratio above and the CHD Risk Table to determine the patient's CHD Risk.        ATP III CLASSIFICATION (LDL):  <100     mg/dL   Optimal  100-129  mg/dL   Near or Above                    Optimal  130-159  mg/dL   Borderline  160-189  mg/dL   High  >190     mg/dL   Very High Performed at [facility],  [address], [place], [place] [address]   TSH     Status: None   Collection Time: 05/24/17  6:10  AM  Result Value Ref Range   TSH 2.019 0.350 - 4.500 uIU/mL    Comment: Performed by a 3rd Generation assay with a functional sensitivity of <=0.01 uIU/mL. Performed at [facility], [place] [address] [place], [place] [address]   Prolactin     Status: None   Collection Time: 05/24/17  6:10 AM  Result Value Ref Range   Prolactin 14.0 4.8 - 23.3 ng/mL    Comment: (NOTE) Performed At: [facility] [place], [place] [id number] Lindon Romp MD [id number]:[id number] Performed at [facility], [place] [address] [place], [place] [address]   Glucose, capillary     Status: Abnormal   Collection Time: 05/24/17  6:11 AM  Result Value Ref Range   Glucose-Capillary 215 (H) 65 - 99 mg/dL   Comment 1 Notify RN   Glucose, capillary     Status: Abnormal   Collection Time: 05/24/17 12:07 PM  Result Value Ref Range   Glucose-Capillary 186 (H) 65 - 99 mg/dL  Glucose, capillary     Status: Abnormal   Collection Time: 05/24/17  4:59 PM  Result Value Ref Range   Glucose-Capillary 225 (H) 65 - 99 mg/dL  Glucose, capillary     Status: Abnormal   Collection Time: 05/24/17  9:03 PM  Result Value Ref Range   Glucose-Capillary 278 (H) 65 - 99 mg/dL   Comment 1 Notify RN   Pregnancy, urine     Status: None   Collection Time: 05/24/17 10:18 PM  Result Value Ref Range   Preg Test, Ur NEGATIVE NEGATIVE    Comment:        THE SENSITIVITY OF THIS METHODOLOGY IS >20 mIU/mL. Performed at [facility], [place] [address], [place], [place] [address]   Glucose, capillary     Status: Abnormal   Collection Time: 05/25/17  6:08 AM  Result Value Ref Range   Glucose-Capillary 257 (H) 65 - 99 mg/dL   Comment 1 Notify RN   TSH     Status: None   Collection Time: 05/25/17  6:30 AM  Result Value Ref Range   TSH 2.050 0.350 - 4.500 uIU/mL    Comment: Performed by a 3rd Generation assay with a functional sensitivity of <=0.01 uIU/mL. Performed at  [facility], [place] [address], [place], [place] [address]   Glucose, capillary     Status: Abnormal   Collection Time: 05/25/17 12:08 PM  Result Value Ref Range   Glucose-Capillary 268 (H) 65 - 99 mg/dL   Comment 1 Notify RN     Blood Alcohol level:  No results found for: [facility]  Metabolic Disorder Labs: Lab Results  Component Value Date   HGBA1C 9.3 (H) 05/24/2017   MPG 220 05/24/2017   Lab Results  Component Value Date   PROLACTIN 14.0 05/24/2017   Lab Results  Component Value Date   CHOL 156 05/24/2017   TRIG 408 (H) 05/24/2017   HDL 31 (L) 05/24/2017   CHOLHDL 5.0 05/24/2017   VLDL UNABLE TO CALCULATE IF TRIGLYCERIDE OVER 400 mg/dL 05/24/2017   LDLCALC UNABLE TO CALCULATE IF TRIGLYCERIDE OVER 400 mg/dL 05/24/2017    Physical Findings: AIMS: Facial and Oral Movements Muscles of Facial Expression: None, normal Lips and Perioral Area: None, normal Jaw: None, normal Tongue: None, normal,Extremity Movements Upper (arms, wrists, hands,  fingers): None, normal Lower (legs, knees, ankles, toes): None, normal, Trunk Movements Neck, shoulders, hips: None, normal, Overall Severity Severity of abnormal movements (highest score from questions above): None, normal Incapacitation due to abnormal movements: None, normal Patient's awareness of abnormal movements (rate only patient's report): No Awareness, Dental Status Current problems with teeth and/or dentures?: No Does patient usually wear dentures?: No  CIWA:    COWS:     Musculoskeletal: Strength & Muscle Tone: within normal limits Gait & Station: normal Patient leans: N/A  Psychiatric Specialty Exam: Physical Exam  ROS denies chest pain, denies shortness of breath, reports some palpitations   Blood pressure 124/81, pulse 100, temperature 98 F (36.7 C), temperature source Oral, resp. rate 18, height 5' 4"  (1.626 m), weight (!) 197.9 kg (436 lb 3.2 oz), last menstrual period 04/22/2017, SpO2 99 %.Body  mass index is 74.87 kg/m.  General Appearance: Fairly Groomed  Eye Contact:  Fair  Speech:  Normal Rate  Volume:  Decreased  Mood:  reports some improvement, but continues to feel depressed,sad  Affect:  constricted, mildly anxious, reactive, smiles at times appropriately   Thought Process:  Linear and Descriptions of Associations: Intact  Orientation:  Full (Time, Place, and Person)  Thought Content:  denies hallucinations, no delusions, not internally preoccupied   Suicidal Thoughts:  Yes.  without intent/plan endorses passive SI, but denies any active suicidal or self injurious ideations, contracts for safety on unit,  denies any homicidal or violent ideations   Homicidal Thoughts:  No  Memory:  recent and remote grossly intact   Judgement:  Fair- improving   Insight:  Fair- improving   Psychomotor Activity:  Normal  Concentration:  Concentration: Good and Attention Span: Good  Recall:  Good  Fund of Knowledge:  Good  Language:  Good  Akathisia:  Negative  Handed:  Right  AIMS (if indicated):     Assets:  Communication Skills Desire for Improvement Resilience  ADL's:  Intact  Cognition:  WNL  Sleep:  Number of Hours: 6.75   Assessment - patient remains depressed, anxious, although endorses some improvement compared to prior to admission. At this time endorses some residual passive SI, but denies any plan or intention of suicide and contracts for safety. Tolerating Prozac well, describes history of good response- states she did best at higher doses, up to 80 mgrs daily in the past .    Treatment Plan Summary: Daily contact with patient to assess and evaluate symptoms and progress in treatment, Medication management, Plan inpatient admission  and medications as below Encourage patient to participate in groups and milieu to work on coping skills and symptom reduction  Increase Prozac to 30 mgrs QDAY for depression and anxiety Continue Lamictal 25 mgrs QDAY for mood disorder,  depression Patient has history of DM, HTN. Fingerstick glucose levels remain elevated in the 200 range- last one 268. BP improved, but presents with ongoing tachycardia- have requested hospitalist consultation for recommendations regarding HTN and DM management  Treatment team working on disposition planning options  Jenne Campus, MD 05/25/2017, 2:08 PM

## 2017-05-25 NOTE — Consult Note (Signed)
[facility] Medical Consultation  Pam Vanalstine XBJ:[id number] DOB: October 27, 1986 DOA: 05/23/2017   PCP: Dema Severin, NP    Requesting physician: Dr. Jama Flavors Date of consultation: 05/25/2017 Reason for consultation: Management of diabetes and hypertension  Chief Complaint: High blood glucose levels  HPI: [name] is a 31 y.o. female  with a past medical history of diabetes, hypertension, anxiety disorder and depression who is admitted to behavioral health due to worsening depression. Medicine was consulted due to elevated blood glucose and for management of hypertension. Patient denies any chest pain, shortness of breath. She does complain of knee pain which has been ongoing for at least a month. She takes 5 different oral diabetic medications, which is managed by her outpatient providers. She says that her HbA1c was down to about 6 or 7. Patient denies any nausea, vomiting or diarrhea. No headaches.   Home Medications: Prior to Admission medications   Medication Sig Start Date End Date Taking? Authorizing Provider  ibuprofen (ADVIL,MOTRIN) 200 MG tablet Take 400 mg by mouth every 6 (six) hours as needed for moderate pain.   Yes [provider]  Multiple Vitamin (MULTIVITAMIN WITH MINERALS) TABS tablet Take 1 tablet by mouth daily.   Yes [provider]  atorvastatin (LIPITOR) 20 MG tablet Take 20 mg by mouth daily.    [provider]  cholestyramine (QUESTRAN) 4 g packet Take 4 g by mouth 2 (two) times daily.    [provider]  dicyclomine (BENTYL) 10 MG capsule Take 10 mg by mouth 2 (two) times daily.    [provider]  FLUoxetine (PROZAC) 40 MG capsule Take 40 mg by mouth daily.    [provider]  fluticasone (FLONASE) 50 MCG/ACT nasal spray Place 2 sprays into both nostrils daily.    [provider]  gabapentin (NEURONTIN) 100 MG capsule Take 100 mg by mouth 2 (two) times daily.    [provider]  glimepiride (AMARYL) 2 MG tablet Take 2 mg by mouth daily with breakfast.    [provider]  lisinopril-hydrochlorothiazide (PRINZIDE,ZESTORETIC) 20-25 MG tablet Take 1 tablet by mouth daily.    [provider]  metoprolol tartrate (LOPRESSOR) 50 MG tablet Take 50 mg by mouth 2 (two) times daily.    [provider]  omeprazole (PRILOSEC) 40 MG capsule Take 40 mg by mouth daily.    [provider]  pioglitazone (ACTOS) 15 MG tablet Take 15 mg by mouth daily.    [provider]    Current Inpatient Medications:  Scheduled: . atorvastatin  20 mg Oral Daily  . [START ON 05/26/2017] FLUoxetine  30 mg Oral Daily  . [START ON 05/26/2017] glimepiride  2 mg Oral Q breakfast  . insulin aspart  0-20 Units Subcutaneous TID WC  . insulin aspart  0-5 Units Subcutaneous QHS  . insulin glargine  6 Units Subcutaneous QHS  . lamoTRIgine  25 mg Oral Daily  . lisinopril  2.5 mg Oral Daily  . metoprolol tartrate  25 mg Oral BID  . nystatin   Topical QHS  . pantoprazole  80 mg Oral Daily  . pioglitazone  15 mg Oral Daily   Continuous:  [id number], alum & mag hydroxide-simeth, LORazepam, magnesium hydroxide  Allergies:  Allergies  Allergen Reactions  . Shellfish Allergy Anaphylaxis    Past Medical History: Past Medical History:  Diagnosis Date  . Anxiety   . Bipolar disorder ([facility])   . Depression   . Diabetes mellitus without  complication ([facility])   . GERD (gastroesophageal reflux disease)   . Hypertension     Past Surgical History:  Procedure Laterality Date  . CHOLECYSTECTOMY      Social History:  Social History   Social History  . Marital status: Single    Spouse name: N/A  . Number of children: N/A  . Years of education: N/A   Occupational History  . Not on file.   Social History Main Topics  . Smoking status: Never Smoker  . Smokeless tobacco: Never Used  . Alcohol use No  . Drug use: No  . Sexual activity:  No   Other Topics Concern  . Not on file   Social History Narrative  . No narrative on file     Review of Systems - Negative except as stated in the history of present illness  Physical Examination: Vitals:   05/25/17 0615 05/25/17 0616 05/25/17 1200 05/25/17 1700  BP: 98/76 120/79 124/81 122/84  Pulse: (!) 112 (!) 116 100 (!) 101  Resp: 18     Temp: 98 F (36.7 C)     TempSrc: Oral     SpO2:      Weight:      Height:        General appearance: alert, cooperative, appears stated age and no distress Resp: clear to auscultation bilaterally Cardio: regular rate and rhythm, S1, S2 normal, no murmur, click, rub or gallop GI: soft, non-tender; bowel sounds normal; no masses,  no organomegaly Neurologic: Alert and oriented X 3, normal strength and tone.   Laboratory Data: Results for orders placed or performed during the hospital encounter of 05/23/17 (from the past 48 hour(s))  Glucose, capillary     Status: Abnormal   Collection Time: 05/23/17  9:31 PM  Result Value Ref Range   Glucose-Capillary 208 (H) 65 - 99 mg/dL  Hemoglobin Z6X     Status: Abnormal   Collection Time: 05/24/17  6:10 AM  Result Value Ref Range   Hgb A1c MFr Bld 9.3 (H) 4.8 - 5.6 %    Comment: (NOTE)         Pre-diabetes: 5.7 - 6.4         Diabetes: >6.4         Glycemic control for adults with diabetes: <7.0    Mean Plasma Glucose 220 mg/dL    Comment: (NOTE) Performed At: [facility] [address] [place], [place] [id number] Mila Homer MD [id number] Performed at [facility], [address] [place], [place] [address]   Lipid panel     Status: Abnormal   Collection Time: 05/24/17  6:10 AM  Result Value Ref Range   Cholesterol 156 0 - 200 mg/dL   Triglycerides 657 (H) <150 mg/dL   HDL 31 (L) >84 mg/dL   Total CHOL/HDL Ratio 5.0 RATIO   VLDL UNABLE TO CALCULATE IF TRIGLYCERIDE OVER 400 mg/dL 0 - 40 mg/dL   LDL Cholesterol UNABLE TO CALCULATE IF  TRIGLYCERIDE OVER 400 mg/dL 0 - 99 mg/dL    Comment:        Total Cholesterol/HDL:CHD Risk Coronary Heart Disease Risk Table                     Men   Women  1/2 Average Risk   3.4   3.3  Average Risk       5.0   4.4  2 X Average Risk   9.6   7.1  3  X Average Risk  23.4   11.0        Use the calculated Patient Ratio above and the CHD Risk Table to determine the patient's CHD Risk.        ATP III CLASSIFICATION (LDL):  <100     mg/dL   Optimal  161-096100-129  mg/dL   Near or Above                    Optimal  130-159  mg/dL   Borderline  045-409160-189  mg/dL   High  >811>190     mg/dL   Very High Performed at [facility], [address] [place], [place] [address]   TSH     Status: None   Collection Time: 05/24/17  6:10 AM  Result Value Ref Range   TSH 2.019 0.350 - 4.500 uIU/mL    Comment: Performed by a 3rd Generation assay with a functional sensitivity of <=0.01 uIU/mL. Performed at [facility], [address] [place], [place] [address]   Prolactin     Status: None   Collection Time: 05/24/17  6:10 AM  Result Value Ref Range   Prolactin 14.0 4.8 - 23.3 ng/mL    Comment: (NOTE) Performed At: [facility]  [address] [place], [place] [id number] Mila HomerHancock William F MD ON:[phone] Performed at [facility], [address] [place], [place] [address]   Glucose, capillary     Status: Abnormal   Collection Time: 05/24/17  6:11 AM  Result Value Ref Range   Glucose-Capillary 215 (H) 65 - 99 mg/dL   Comment 1 Notify RN   Glucose, capillary     Status: Abnormal   Collection Time: 05/24/17 12:07 PM  Result Value Ref Range   Glucose-Capillary 186 (H) 65 - 99 mg/dL  Glucose, capillary     Status: Abnormal   Collection Time: 05/24/17  4:59 PM  Result Value Ref Range   Glucose-Capillary 225 (H) 65 - 99 mg/dL  Glucose, capillary     Status: Abnormal   Collection Time: 05/24/17  9:03 PM  Result Value Ref Range   Glucose-Capillary 278  (H) 65 - 99 mg/dL   Comment 1 Notify RN   Pregnancy, urine     Status: None   Collection Time: 05/24/17 10:18 PM  Result Value Ref Range   Preg Test, Ur NEGATIVE NEGATIVE    Comment:        THE SENSITIVITY OF THIS METHODOLOGY IS >20 mIU/mL. Performed at [facility], [address] [place], [place] [address]   Glucose, capillary     Status: Abnormal   Collection Time: 05/25/17  6:08 AM  Result Value Ref Range   Glucose-Capillary 257 (H) 65 - 99 mg/dL   Comment 1 Notify RN   TSH     Status: None   Collection Time: 05/25/17  6:30 AM  Result Value Ref Range   TSH 2.050 0.350 - 4.500 uIU/mL    Comment: Performed by a 3rd Generation assay with a functional sensitivity of <=0.01 uIU/mL. Performed at [facility], [address] [place], [place] [address]   Glucose, capillary     Status: Abnormal   Collection Time: 05/25/17 12:08 PM  Result Value Ref Range   Glucose-Capillary 268 (H) 65 - 99 mg/dL   Comment 1 Notify RN   Glucose, capillary     Status: Abnormal   Collection Time: 05/25/17  5:13 PM  Result Value Ref  Range   Glucose-Capillary 264 (H) 65 - 99 mg/dL    Imaging Studies: No results found.  EKG: Sinus tachycardia in 112 beats a minute. Normal axis. Intervals are normal. No concerning ST or T-wave changes  Impression/Recommendations   #1 Essential hypertension: Blood pressure is reasonably well controlled. Patient was noted to be tachycardic. She takes metoprolol at home primarily for tachycardia. She states that she's been told her elevated heart rate is due to anxiety. Her TSH is normal. She does not think she's ever had an echocardiogram. She was told that she can ask her primary care physician regarding this test. Continue lisinopril and metoprolol for now.  #2 Diabetes mellitus type II, poorly controlled: HbA1c is 9.3. CBGs are in the 200s. Patient is noted to be on Lantus. She does not take this at home. She takes only oral  medications at home. She tells me that she takes Actos, glimepiride, metformin and 2 others. We'll reinitiate her glimepiride and Actos. We will check a basic metabolic panel tomorrow morning. If there is no renal insufficiency or metabolic acidosis, metformin can be initiated at 1000mg  twice daily. This should result in better glycemic control. Leave her on the Lantus for now. Continue to monitor CBGs. As her CBG start trending down, Lantus can be slowly tapered off. Would request that pharmacy find out about the other 2 medications that she supposedly was taking at home.  TRH will followup again tomorrow. Please contact me if I can be of assistance in the meanwhile. Thank you for this consultation.  [facility]  [facility] Pager [phone]  If 7PM-7AM, please contact night-coverage.  [email or web address] Password [facility]  05/25/2017, 5:56 PM

## 2017-05-25 NOTE — Plan of Care (Signed)
Problem: Activity: Goal: Interest or engagement in activities will improve Outcome: Progressing Patient is visible interacting with peers in the milieu. Patient attends groups.

## 2017-05-25 NOTE — Progress Notes (Signed)
Nursing Progress Note 1900-0730  D) Patient presents pleasant and cooperative. Patient appears anxious at times. Patient reports she is working on "managing my stressors and managing my medications". Patient reports she hopes "to get off the insulin again soon". Patient is seen interactive in the milieu. Patient reports passive SI but denies HI/AVH or pain. Patient contracts for safety on the unit. Patient reports sleeping well with current regimen. Patient instructed by staff to wear yellow hospital socks.  A) Emotional support given. 1:1 interaction and active listening provided. Patient medicated as prescribed. Medications and plan of care reviewed with patient. Patient verbalized understanding without further questions. Snacks and fluids provided. Opportunities for questions or concerns presented to patient. Patient encouraged to continue to work on treatment goals. Labs, vital signs and patient behavior monitored throughout shift. Patient safety maintained with q15 min safety checks. High fall risk precautions in place and reviewed with patient; patient verbalized understanding.  R) Patient receptive to interaction with nurse. Patient remains safe on the unit at this time. Patient denies any adverse medication reactions at this time. Patient is resting in bed without complaints. Will continue to monitor.

## 2017-05-25 NOTE — [facility] Group Notes (Signed)
[facility] LCSW Group Therapy 05/25/2017 1:15 PM  Type of Therapy: Group Therapy- Feelings about Diagnosis  Participation Level: Active   Participation Quality:  Appropriate  Affect:  Appropriate  Cognitive: Alert and Oriented   Insight:  Developing   Engagement in Therapy: Developing/Improving and Engaged   Modes of Intervention: Clarification, Confrontation, Discussion, Education, Exploration, Limit-setting, Orientation, Problem-solving, Rapport Building, Dance movement psychotherapisteality Testing, Socialization and Support  Description of Group:   This group will allow patients to explore their thoughts and feelings about diagnoses they have received. Patients will be guided to explore their level of understanding and acceptance of these diagnoses. Facilitator will encourage patients to process their thoughts and feelings about the reactions of others to their diagnosis, and will guide patients in identifying ways to discuss their diagnosis with significant others in their lives. This group will be process-oriented, with patients participating in exploration of their own experiences as well as giving and receiving support and challenge from other group members.  Summary of Progress/Problems:  Pt states that her family has a difficult time understanding her diagnosis. Pt explained that sometimes she will have good days when she is in a great mood and her family will say things to her like "I thought you were supposed to be depressed?". Pt wishes that they were more understanding and supportive.  Therapeutic Modalities:   Cognitive Behavioral Therapy Solution Focused Therapy Motivational Interviewing Relapse Prevention Therapy  Jonathon JordanLynn B Petra Sargeant, MSW, LCSWA [phone] 05/25/2017 4:03 PM

## 2017-05-25 NOTE — Progress Notes (Signed)
Staff explain [facility] policy falls from the handbook given to every pt admit to the unit. Pt was rude in group when staff asked if you have yellow wrist band to wear yellow socks. Staff explain there need to be a grip for safety bottom of the socks to prevent falls. Staff gave pt socks and pt refused to put the socks on.

## 2017-05-26 DIAGNOSIS — F332 Major depressive disorder, recurrent severe without psychotic features: Principal | ICD-10-CM

## 2017-05-26 LAB — GLUCOSE, CAPILLARY
Glucose-Capillary: 253 mg/dL — ABNORMAL HIGH (ref 65–99)
Glucose-Capillary: 198 mg/dL — ABNORMAL HIGH (ref 65–99)
Glucose-Capillary: 247 mg/dL — ABNORMAL HIGH (ref 65–99)
Glucose-Capillary: 220 mg/dL — ABNORMAL HIGH (ref 65–99)

## 2017-05-26 LAB — BASIC METABOLIC PANEL
Anion gap: 12 (ref 5–15)
BUN: 10 mg/dL (ref 6–20)
CO2: 26 mmol/L (ref 22–32)
Calcium: 9.2 mg/dL (ref 8.9–10.3)
Chloride: 100 mmol/L — ABNORMAL LOW (ref 101–111)
Creatinine, Ser: 0.58 mg/dL (ref 0.44–1.00)
GFR calc Af Amer: >60 mL/min (ref >60)
GFR calc non Af Amer: >60 mL/min (ref >60)
Glucose, Bld: 250 mg/dL — ABNORMAL HIGH (ref 65–99)
Potassium: 4.4 mmol/L (ref 3.5–5.1)
Sodium: 138 mmol/L (ref 135–145)

## 2017-05-26 MED ORDER — INSULIN GLARGINE 100 UNIT/ML ~~LOC~~ SOLN
10.0000 [IU] | Freq: Every day | SUBCUTANEOUS | Status: DC
  Administered 2017-05-26: 10 [IU] via SUBCUTANEOUS

## 2017-05-26 MED ORDER — METFORMIN HCL 500 MG PO TABS
500.0000 mg | ORAL_TABLET | Freq: Two times a day (BID) | ORAL | Status: DC
  Administered 2017-05-26 – 2017-05-27 (×2): 500 mg via ORAL
  Filled 2017-05-26 (×4): qty 1

## 2017-05-26 MED ORDER — FLUOXETINE HCL 20 MG PO CAPS
40.0000 mg | ORAL_CAPSULE | Freq: Every day | ORAL | Status: DC
  Administered 2017-05-27: 40 mg via ORAL
  Filled 2017-05-26 (×2): qty 2

## 2017-05-26 NOTE — Progress Notes (Signed)
Recreation Therapy Notes  Date: 05/26/17 Time: 0930 Location: [address] Dayroom  Group Topic: Stress Management  Goal Area(s) Addresses:  Patient will verbalize importance of using healthy stress management.  Patient will identify positive emotions associated with healthy stress management.   Behavioral Response: Engaged  Intervention: Stress Management  Activity :  Writertarry Sky Relaxation.  LRT introduced the stress management technique of guided imagery.  LRT read a script to allow patients to follow along and participate in the activity.  Patients were to follow along as the script was read to engage in the activity.   Education:  Stress Management, Discharge Planning.   Education Outcome: Acknowledges edcuation/In group clarification offered/Needs additional education  Clinical Observations/Feedback: Pt attended group.   Caroll RancherMarjette Kendricks Reap, LRT/CTRS         Caroll RancherLindsay, Malyssa Maris A 05/26/2017 12:35 PM

## 2017-05-26 NOTE — Progress Notes (Signed)
Adult Psychoeducational Group Note  Date:  05/26/2017 Time:  10:32 PM  Group Topic/Focus:  Wrap-Up Group:   The focus of this group is to help patients review their daily goal of treatment and discuss progress on daily workbooks.  Participation Level:  Active  Participation Quality:  Appropriate  Affect:  Appropriate  Cognitive:  Alert  Insight: Appropriate  Engagement in Group:  Engaged  Modes of Intervention:  Discussion  Additional Comments:  Patient rated her day a 10. Patient's goal for today was to start making plans for changes at home. Patient stated she will be discharging tomorrow.   Alyssandra Hulsebus L Rontavious Albright 05/26/2017, 10:32 PM

## 2017-05-26 NOTE — Progress Notes (Signed)
Nursing Progress Note: 7p-7a D: Pt currently presents with a pleasant/anxious affect and behavior. Pt states "I am ready to go home, so I feel great today." Interacting appropriately with milieu. Pt reports fair sleep with current medication regimen.   A: Pt provided with medications per providers orders. Pt's labs and vitals were monitored throughout the night. Pt supported emotionally and encouraged to express concerns and questions. Pt educated on medications.  R: Pt's safety ensured with 15 minute and environmental checks. Pt currently denies SI/HI/Self Harm and AVH. Pt verbally contracts to seek staff if SI/HI or A/VH occurs and to consult with staff before acting on any harmful thoughts. Will continue to monitor.

## 2017-05-26 NOTE — Progress Notes (Signed)
Inpatient Diabetes Program Recommendations  AACE/ADA: New Consensus Statement on Inpatient Glycemic Control (2015)  Target Ranges:  Prepandial:   less than 140 mg/dL      Peak postprandial:   less than 180 mg/dL (1-2 hours)      Critically ill patients:  140 - 180 mg/dL   Lab Results  Component Value Date   GLUCAP 198 (H) 05/26/2017   HGBA1C 9.3 (H) 05/24/2017    Review of Glycemic Control  Diabetes history: DM2 Outpatient Diabetes medications: metformin, amaryl 2 mg QD, Actos 15 mg QD Current orders for Inpatient glycemic control: Lantus 6 units QHS, Novolog 0-20 units tidwc and hs, Amaryl 2 mg QAM, Actos 15 mg QD  FBS x 2 days > 250 mg/dL. Needs insulin adjustment. HgbA1C indicates sub-par glycemic control at home.  Inpatient Diabetes Program Recommendations:    Increase Lantus to 10 units QHS. Needs f/u with PCP who manages her diabetes  Will follow.  Thank you. [name], RD, LDN, CDE Inpatient Diabetes Coordinator [phone]

## 2017-05-26 NOTE — Progress Notes (Signed)
[facility] MD Progress Note  05/26/2017 11:22 AM [name]  MRN:  [id number] Subjective:  Patient reports partial but significant improvement . She states " I feel much better, more like myself". She denies medication side effects and feels that they are helping . Denies any suicidal ideations. As she improves she is starting to focus more on discharge, and states she is " really missing my family, my kids", hoping to discharge soon .  Objective : I have discussed case with treatment team and have met with patient . Staff reports indicate that patient is progressing, mood is improving. Presents with improving mood and range of affect. Affect is brighter . Denies any suicidal ideations today. No disruptive or agitated behaviors on unit, going to groups. Denies medication side effects . Patient seen by hospitalist consultant .  Principal Problem:  MDD  Diagnosis:   Patient Active Problem List   Diagnosis Date Noted  . Severe recurrent major depression without psychotic features ([place]) [F33.2] 05/23/2017   Total Time spent with patient: 20 minutes  Past Medical History:  Past Medical History:  Diagnosis Date  . Anxiety   . Bipolar disorder ([place])   . Depression   . Diabetes mellitus without complication ([place])   . GERD (gastroesophageal reflux disease)   . Hypertension     Past Surgical History:  Procedure Laterality Date  . CHOLECYSTECTOMY     Family History: History reviewed. No pertinent family history.  Social History:  History  Alcohol Use No     History  Drug Use No    Social History   Social History  . Marital status: Single    Spouse name: N/A  . Number of children: N/A  . Years of education: N/A   Social History Main Topics  . Smoking status: Never Smoker  . Smokeless tobacco: Never Used  . Alcohol use No  . Drug use: No  . Sexual activity: No   Other Topics Concern  . None   Social History Narrative  . None   Additional Social History:     Pain Medications: no abuse =- see pta meds list Prescriptions: no abuse - see pta meds list Over the Counter: no abuse - see pta meds list History of alcohol / drug use?: No history of alcohol / drug abuse  Sleep: Good  Appetite:  Good  Current Medications: Current Facility-Administered Medications  Medication Dose Route Frequency Provider Last Rate Last Dose  . acetaminophen (TYLENOL) tablet 650 mg  650 mg Oral Q6H PRN Lindon Romp A, NP      . alum & mag hydroxide-simeth (MAALOX/MYLANTA) 200-200-20 MG/5ML suspension 30 mL  30 mL Oral Q4H PRN Lindon Romp A, NP      . atorvastatin (LIPITOR) tablet 20 mg  20 mg Oral Daily Bonnielee Haff, MD   20 mg at 05/26/17 5947  . FLUoxetine (PROZAC) capsule 30 mg  30 mg Oral Daily Cobos, Myer Peer, MD   30 mg at 05/26/17 0839  . glimepiride (AMARYL) tablet 2 mg  2 mg Oral Q breakfast Bonnielee Haff, MD   2 mg at 05/26/17 0839  . insulin aspart (novoLOG) injection 0-20 Units  0-20 Units Subcutaneous TID WC Laverle Hobby, PA-C   11 Units at 05/26/17 [phone]  . insulin aspart (novoLOG) injection 0-5 Units  0-5 Units Subcutaneous QHS Laverle Hobby, PA-C   2 Units at 05/25/17 2152  . insulin glargine (LANTUS) injection 6 Units  6 Units Subcutaneous QHS Laverle Hobby,  PA-C   6 Units at 05/25/17 2153  . lamoTRIgine (LAMICTAL) tablet 25 mg  25 mg Oral Daily Cobos, Myer Peer, MD   25 mg at 05/26/17 1572  . lisinopril (PRINIVIL,ZESTRIL) tablet 2.5 mg  2.5 mg Oral Daily Patriciaann Clan E, PA-C   2.5 mg at 05/26/17 6203  . LORazepam (ATIVAN) tablet 0.5 mg  0.5 mg Oral Q6H PRN Cobos, Myer Peer, MD   0.5 mg at 05/24/17 1953  . magnesium hydroxide (MILK OF MAGNESIA) suspension 30 mL  30 mL Oral Daily PRN Lindon Romp A, NP      . metoprolol tartrate (LOPRESSOR) tablet 25 mg  25 mg Oral BID Laverle Hobby, PA-C   25 mg at 05/26/17 5597  . nystatin (MYCOSTATIN/NYSTOP) topical powder   Topical QHS Derrill Center, NP      . pantoprazole (PROTONIX) EC  tablet 80 mg  80 mg Oral Daily Cobos, Myer Peer, MD   80 mg at 05/26/17 0839  . pioglitazone (ACTOS) tablet 15 mg  15 mg Oral Daily Bonnielee Haff, MD   15 mg at 05/26/17 1106    Lab Results:  Results for orders placed or performed during the hospital encounter of 05/23/17 (from the past 48 hour(s))  Glucose, capillary     Status: Abnormal   Collection Time: 05/24/17 12:07 PM  Result Value Ref Range   Glucose-Capillary 186 (H) 65 - 99 mg/dL  Glucose, capillary     Status: Abnormal   Collection Time: 05/24/17  4:59 PM  Result Value Ref Range   Glucose-Capillary 225 (H) 65 - 99 mg/dL  Glucose, capillary     Status: Abnormal   Collection Time: 05/24/17  9:03 PM  Result Value Ref Range   Glucose-Capillary 278 (H) 65 - 99 mg/dL   Comment 1 Notify RN   Pregnancy, urine     Status: None   Collection Time: 05/24/17 10:18 PM  Result Value Ref Range   Preg Test, Ur NEGATIVE NEGATIVE    Comment:        THE SENSITIVITY OF THIS METHODOLOGY IS >20 mIU/mL. Performed at [facility] : [facility], [place] [address], [place], [place] [address]   Glucose, capillary     Status: Abnormal   Collection Time: 05/25/17  6:08 AM  Result Value Ref Range   Glucose-Capillary 257 (H) 65 - 99 mg/dL   Comment 1 Notify RN   TSH     Status: None   Collection Time: 05/25/17  6:30 AM  Result Value Ref Range   TSH 2.050 0.350 - 4.500 uIU/mL    Comment: Performed by a 3rd Generation assay with a functional sensitivity of <=0.01 uIU/mL. Performed at [facility], [place] [address], [place], [place] [address]   Glucose, capillary     Status: Abnormal   Collection Time: 05/25/17 12:08 PM  Result Value Ref Range   Glucose-Capillary 268 (H) 65 - 99 mg/dL   Comment 1 Notify RN   Glucose, capillary     Status: Abnormal   Collection Time: 05/25/17  5:13 PM  Result Value Ref Range   Glucose-Capillary 264 (H) 65 - 99 mg/dL  Glucose, capillary     Status: Abnormal   Collection Time: 05/25/17   8:22 PM  Result Value Ref Range   Glucose-Capillary 210 (H) 65 - 99 mg/dL   Comment 1 Notify RN   Glucose, capillary     Status: Abnormal   Collection Time: 05/26/17  6:21 AM  Result Value Ref  Range   Glucose-Capillary 253 (H) 65 - 99 mg/dL  Basic metabolic panel     Status: Abnormal   Collection Time: 05/26/17  6:52 AM  Result Value Ref Range   Sodium 138 135 - 145 mmol/L   Potassium 4.4 3.5 - 5.1 mmol/L   Chloride 100 (L) 101 - 111 mmol/L   CO2 26 22 - 32 mmol/L   Glucose, Bld 250 (H) 65 - 99 mg/dL   BUN 10 6 - 20 mg/dL   Creatinine, Ser 0.58 0.44 - 1.00 mg/dL   Calcium 9.2 8.9 - 10.3 mg/dL   GFR calc non Af Amer >60 >60 mL/min   GFR calc Af Amer >60 >60 mL/min    Comment: (NOTE) The eGFR has been calculated using the CKD EPI equation. This calculation has not been validated in all clinical situations. eGFR's persistently <60 mL/min signify possible Chronic Kidney Disease.    Anion gap 12 5 - 15    Comment: Performed at [facility], [place] [address], [place], [place] [address]    Blood Alcohol level:  No results found for: [facility]  Metabolic Disorder Labs: Lab Results  Component Value Date   HGBA1C 9.3 (H) 05/24/2017   MPG 220 05/24/2017   Lab Results  Component Value Date   PROLACTIN 14.0 05/24/2017   Lab Results  Component Value Date   CHOL 156 05/24/2017   TRIG 408 (H) 05/24/2017   HDL 31 (L) 05/24/2017   CHOLHDL 5.0 05/24/2017   VLDL UNABLE TO CALCULATE IF TRIGLYCERIDE OVER 400 mg/dL 05/24/2017   LDLCALC UNABLE TO CALCULATE IF TRIGLYCERIDE OVER 400 mg/dL 05/24/2017    Physical Findings: AIMS: Facial and Oral Movements Muscles of Facial Expression: None, normal Lips and Perioral Area: None, normal Jaw: None, normal Tongue: None, normal,Extremity Movements Upper (arms, wrists, hands, fingers): None, normal Lower (legs, knees, ankles, toes): None, normal, Trunk Movements Neck, shoulders, hips: None, normal, Overall Severity Severity of  abnormal movements (highest score from questions above): None, normal Incapacitation due to abnormal movements: None, normal Patient's awareness of abnormal movements (rate only patient's report): No Awareness, Dental Status Current problems with teeth and/or dentures?: No Does patient usually wear dentures?: No  CIWA:    COWS:     Musculoskeletal: Strength & Muscle Tone: within normal limits Gait & Station: normal Patient leans: N/A  Psychiatric Specialty Exam: Physical Exam  ROS today denies chest pain, no shortness of breath  Blood pressure 136/68, pulse (!) 118, temperature 98.8 F (37.1 C), resp. rate 18, height 5' 4" (1.626 m), weight (!) 197.9 kg (436 lb 3.2 oz), last menstrual period 04/22/2017, SpO2 99 %.Body mass index is 74.87 kg/m.  General Appearance: Well Groomed  Eye Contact:  Good  Speech:  Normal Rate  Volume:  Normal  Mood:  improving, less depressed  Affect:  more reactive   Thought Process:  Goal Directed and Descriptions of Associations: Intact  Orientation:  Other:  fully alert and attentive   Thought Content:  no psychotic symptoms noted or endorsed   Suicidal Thoughts:  No denies any suicidal or self injurious ideations at this time, is future oriented   Homicidal Thoughts:  No  Memory:  recent and remote grossly intact   Judgement: improving   Insight:  improving   Psychomotor Activity:  Normal  Concentration:  Concentration: Good and Attention Span: Good  Recall:  Good  Fund of Knowledge:  Good  Language:  Good  Akathisia:  Negative  Handed:  Right  AIMS (if indicated):     Assets:  Communication Skills Desire for Improvement Resilience  ADL's:  Intact  Cognition:  WNL  Sleep:  Number of Hours: 6.75   Assessment - patient is presenting with significant improvement . She is describing improving mood, and her affect is more reactive and brighter. She denies SI today. She states she has responded well to higher doses of Prozac in the past, did  not have any side effects. Is hoping to continue gradual Fluoxetine titration. Thus far tolerating Lamictal trial well- no rash.   Treatment Plan Summary: Treatment plan reviewed as below today 6/20 Daily contact with patient to assess and evaluate symptoms and progress in treatment, Medication management, Plan inpatient admission  and medications as below Encourage patient to participate in groups and milieu to work on coping skills and symptom reduction  Increase Prozac to 40  mgrs QDAY for depression and anxiety Continue Lamictal 25 mgrs QDAY for mood disorder, depression Treatment team working on disposition planning options   Jenne Campus, MD 05/26/2017, 11:22 AM   Patient ID: [name], female   DOB: October 05, 1986, 31 y.o.   MRN: [id number]

## 2017-05-26 NOTE — Progress Notes (Signed)
D  ---   Pt agrees to contract for safety and denies pain.  She has  good  eye contact and is friendly and receptive to staff. Pt interacts well with staff .  Pt attends all   groups  and appears to be vested in treatment.  .she interacts well with peers.  Pt takes medications as asked and shows no sign of adverse effects.  Pt is a high fall risk, but ambulates hall without assistance , though has a slightly un-steady gait.  Pt has  a walker to use as needed .  Pt is eating well and has good sleep.  Pt shows no negative behaviors .   ---  A  ---  Provide support , safety and encouragement  ---  R  ---  Pt remains safe on unit  Patient ID: [name], female   DOB: 1986-08-01, 31 y.o.   MRN: [id number]

## 2017-05-26 NOTE — [facility] Group Notes (Signed)
[facility] Mental [employer] Association Group Therapy 05/26/2017 1:15pm  Type of Therapy: Mental Health Association Presentation  Participation Level: Active  Participation Quality: Attentive  Affect: Appropriate  Cognitive: Oriented  Insight: Developing/Improving  Engagement in Therapy: Engaged  Modes of Intervention: Discussion, Education and Socialization  Summary of Progress/Problems: Mental Health Association (MHA) Speaker came to talk about his personal journey with substance abuse and addiction. The pt processed ways by which to relate to the speaker. MHA speaker provided handouts and educational information pertaining to groups and services offered by the MHA. Pt was engaged in speaker's presentation and was receptive to resources provided.    Ellee Wawrzyniak B. Keyaira Clapham, MSW, LCSWA 05/26/2017 2:36 PM   

## 2017-05-26 NOTE — Progress Notes (Signed)
Patient ID: [name], female   DOB: 1985-12-28, 31 y.o.   MRN: [id number] Hospitalist service is following the patient for hypertension and diabetes management. I have reviewed her vital signs including blood pressure readings as well as her blood sugar levels over the last 24 hours. Her Accu-Cheks have been 210, 253 and 198 recently. She is currently on glimepiride 2 mg by mouth daily, Actos 15 mg by mouth daily along with Lantus 6 units subcutaneous daily and sliding scale insulin. Her hemoglobin A1c on 05/24/2017 was 9.3. She states that she also takes Victoza at home and metformin 500 mg by mouth twice a day along with one other medication at home. She has a follow-up with her primary care provider this week of discharge. I will add metformin 500 mg by mouth twice a day the current regimen and increase Lantus to 10 units subcutaneous daily. Upon discharge she can continue with her current home regimen for diabetes treatment and follow-up with her primary care provider as soon as possible regarding the need for insulin therapy. Thank you for the consult. Hospitalist service will sign off

## 2017-05-27 ENCOUNTER — Encounter (HOSPITAL_COMMUNITY): Payer: Self-pay | Admitting: Behavioral Health

## 2017-05-27 LAB — GLUCOSE, CAPILLARY
Glucose-Capillary: 208 mg/dL — ABNORMAL HIGH (ref 65–99)
Glucose-Capillary: 170 mg/dL — ABNORMAL HIGH (ref 65–99)

## 2017-05-27 MED ORDER — METOPROLOL TARTRATE 25 MG PO TABS: 25.0000 mg | ORAL_TABLET | Freq: Two times a day (BID) | ORAL | 0 refills | Status: DC

## 2017-05-27 MED ORDER — GLIMEPIRIDE 2 MG PO TABS: 2.0000 mg | ORAL_TABLET | Freq: Every day | ORAL | 0 refills | Status: DC

## 2017-05-27 MED ORDER — LISINOPRIL 2.5 MG PO TABS: 2.5000 mg | ORAL_TABLET | Freq: Every day | ORAL | 0 refills | Status: DC

## 2017-05-27 MED ORDER — LAMOTRIGINE 25 MG PO TABS: 25.0000 mg | ORAL_TABLET | Freq: Every day | ORAL | 0 refills | Status: DC

## 2017-05-27 MED ORDER — ATORVASTATIN CALCIUM 20 MG PO TABS: 20.0000 mg | ORAL_TABLET | Freq: Every day | ORAL | 0 refills | Status: DC

## 2017-05-27 MED ORDER — INSULIN GLARGINE 100 UNIT/ML ~~LOC~~ SOLN: 10.0000 [IU] | Freq: Every day | SUBCUTANEOUS | 11 refills | Status: DC

## 2017-05-27 MED ORDER — METFORMIN HCL 500 MG PO TABS: 500.0000 mg | ORAL_TABLET | Freq: Two times a day (BID) | ORAL | 0 refills | Status: DC

## 2017-05-27 MED ORDER — NYSTATIN 100000 UNIT/GM EX POWD: Freq: Every day | CUTANEOUS | 0 refills | Status: DC

## 2017-05-27 MED ORDER — FLUOXETINE HCL 40 MG PO CAPS: 40.0000 mg | ORAL_CAPSULE | Freq: Every day | ORAL | 0 refills | Status: DC

## 2017-05-27 MED ORDER — PIOGLITAZONE HCL 15 MG PO TABS
15.0000 mg | ORAL_TABLET | Freq: Every day | ORAL | 0 refills | Status: DC
Start: 2017-05-28 — End: 2021-12-29

## 2017-05-27 MED ORDER — PANTOPRAZOLE SODIUM 40 MG PO TBEC: 80.0000 mg | DELAYED_RELEASE_TABLET | Freq: Every day | ORAL | 0 refills | Status: DC

## 2017-05-27 NOTE — [facility] Suicide Risk Assessment (Signed)
[facility] Discharge Suicide Risk Assessment   Principal Problem:  Depression Discharge Diagnoses:  Patient Active Problem List   Diagnosis Date Noted  . Severe recurrent major depression without psychotic features ([facility]) [F33.2] 05/23/2017    Total Time spent with patient: 30 minutes  Musculoskeletal: Strength & Muscle Tone: within normal limits Gait & Station: normal Patient leans: N/A  Psychiatric Specialty Exam: ROS denies chest pain, no shortness of breath, no vomiting   Blood pressure 136/68, pulse (!) 118, temperature 98.8 F (37.1 C), resp. rate 18, height 5\' 4"  (1.626 m), weight (!) 197.9 kg (436 lb 3.2 oz), last menstrual period 04/22/2017, SpO2 99 %.Body mass index is 74.87 kg/m.  General Appearance: Well Groomed  Eye Contact::  Good  Speech:  Normal Rate409  Volume:  Normal  Mood:  improved,denies feeling depressed at this time, affect more reactive , brighter   Affect:  improved   Thought Process:  Linear and Descriptions of Associations: Intact  Orientation:  Full (Time, Place, and Person)  Thought Content:  denies hallucinations, no delusions, not internally preoccupied   Suicidal Thoughts:  No denies suicidal or self injurious ideations  Homicidal Thoughts:  No denies homicidal or violent ideations  Memory:  recent and remote grossly intact   Judgement:  Other:  improving   Insight:  improving   Psychomotor Activity:  Normal  Concentration:  Good  Recall:  Good  Fund of Knowledge:Good  Language: Good  Akathisia:  Negative  Handed:  Right  AIMS (if indicated):     Assets:  Communication Skills Desire for Improvement Resilience  Sleep:  Number of Hours: 6.5  Cognition: WNL  ADL's:  Intact   Mental Status Per Nursing Assessment::   On Admission:     Demographic Factors:  31 year old female, lives with fiance and her two children  Loss Factors: Long time psychiatrist retired, and new psychiatrist made medication changes that she feels were not effective    Historical Factors: History of depression, history of prior psychiatric admissions but states she had been stable for many years . She has history of responding well to high doses of Prozac .   Risk Reduction Factors:   Responsible for children under 53 years of age, Sense of responsibility to family, Living with another person, especially a relative, Positive social support and Positive coping skills or problem solving skills  Continued Clinical Symptoms:  At this time patient is alert, attentive,well related, pleasant, mood is improved and today presents euthymic, with full range of affect, no thought disorder, no suicidal or self injurious ideations, no hallucinations, no delusions, not internally preoccupied. Denies medication side effects. On unit presents calm and pleasant, pleasant on approach.  Cognitive Features That Contribute To Risk:  No gross cognitive deficits noted upon discharge. Is alert , attentive, and oriented x 3   Suicide Risk:  Mild:  Suicidal ideation of limited frequency, intensity, duration, and specificity.  There are no identifiable plans, no associated intent, mild dysphoria and related symptoms, good self-control (both objective and subjective assessment), few other risk factors, and identifiable protective factors, including available and accessible social support.    Plan Of Care/Follow-up recommendations:  Activity:  as tolerated Diet:  Heart healthy, diabetic diet  Tests:  NA Other:  See below  Patient is feeling ready for discharge, and is leaving in good spirits  Plans to return home Has an established PCP- Mauricio Po at [facility]  Plans to follow up for outpatient psychiatric management  [place]  A Debera Sterba, MD 05/27/2017, 8:28 AM

## 2017-05-27 NOTE — Progress Notes (Signed)
Discharge note:  Patient discharged home per MD order.  Reviewed AVS/transition record with patient and she indicated understanding.  Patient denies any thoughts of self harm.  Patient received prescriptions of her medications.  Patient received all personal belongings from unit and locker. Patient will follow up with [facility].  Patient left ambulatory with her boyfriend.

## 2017-05-27 NOTE — [facility] Suicide Risk Assessment (Signed)
[facility] INPATIENT:  Family/Significant Other Suicide Prevention Education  Suicide Prevention Education:  Contact Attempts: Gayleen OremMary McDaniel (mother [phone]), has been identified by the patient as the family member/significant other with whom the patient will be residing, and identified as the person(s) who will aid the patient in the event of a mental health crisis.  With written consent from the patient, two attempts were made to provide suicide prevention education, prior to and/or following the patient's discharge.  We were unsuccessful in providing suicide prevention education.  A suicide education pamphlet was given to the patient to share with family/significant other.  Date and time of first attempt: 05/27/17 @ 11:29 am. No answer, CSW left a message. Date and time of second attempt: 05/27/17 @ 1:00 pm. No answer, CSW left a message.  Jonathon JordanLynn B Gay Rape, MSW, LCSWA  05/27/2017, 1:01 PM

## 2017-05-27 NOTE — [facility] Group Notes (Signed)
[facility] Group Notes:  (Nursing/MHT/Case Management/Adjunct)  Date:  05/27/2017  Time:  0850  Type of Therapy:  Nursing Education  Participation Level:  Active  Participation Quality:  Appropriate and Attentive  Affect:  Appropriate  Cognitive:  Alert and Appropriate  Insight:  Improving  Engagement in Group:  Supportive  Modes of Intervention:  Support  Summary of Progress/Problems: Patient is looking forward to discharge today.  She is engaged and her affect is bright.  Cranford MonBeaudry, Theresia Pree Evans 05/27/2017, 12:33 PM

## 2017-05-27 NOTE — Progress Notes (Signed)
  [facility]  Will you be returning to the same living situation after discharge:  Yes,  pt returning home. At discharge, do you have transportation home?: Yes,  pt's family will transport. Do you have the ability to pay for your medications: Yes,  pt has insurance.  Release of information consent forms completed and in the chart;  Patient's signature needed at discharge.  Patient to Follow up at: Follow-up Information    [facility] Follow up on 06/01/2017.   Specialty:  Behavioral Health Why:  [facility] Your medication management appointment will also be scheduled at this appointment. Please call the office if you need to cancel/reschedule this appointment. Thank you. Contact information: [address] [place] [address] [phone]           Next level of care provider has access to [facility] Link:no  Safety Planning and Suicide Prevention discussed: Yes,  with pt.  Have you used any form of tobacco in the last 30 days? (Cigarettes, Smokeless Tobacco, Cigars, and/or Pipes): No  Has patient been referred to the Quitline?: N/A patient is not a smoker  Patient has been referred for addiction treatment: Yes  Jonathon JordanLynn B Kijana Estock, MSW, LCSWA 05/27/2017, 1:01 PM

## 2017-05-27 NOTE — Discharge Summary (Signed)
Physician Discharge Summary Note  Patient:  Amy SeveranceKristina Patricia Friedman is an 31 y.o., female MRN:  [id number] DOB:  [birth year] Patient phone:  [phone] (home)  Patient address:   [address] [place] [address],  Total Time spent with patient: 30 minutes  Date of Admission:  05/23/2017 Date of Discharge: 05/27/2017  Reason for Admission: Worsening depression, anxiety, and suicidal ideations.   Principal Problem: Severe recurrent major depression without psychotic features [facility](HCC) Discharge Diagnoses: Patient Active Problem List   Diagnosis Date Noted  . Severe recurrent major depression without psychotic features [facility](HCC) [F33.2] 05/23/2017    Past Psychiatric History: History of depression, history of several psychiatric admissions as an adolescent, but had not been admitted for about 15 years. History of remote suicide attempt as a teenager. Describes anxiety, including excessive worry and some panic attacks. Denies history of psychosis, states she has been diagnosed  with Bipolar Disorder in the past, endorses short lived mood swings, but does not endorse any history of full manic decompensation. She emphasizes depression, anxiety as her major symptoms.   Past Medical History:  Past Medical History:  Diagnosis Date  . Anxiety   . Bipolar disorder (HCC)   . Depression   . Diabetes mellitus without complication (HCC)   . GERD (gastroesophageal reflux disease)   . Hypertension     Past Surgical History:  Procedure Laterality Date  . CHOLECYSTECTOMY     Family History: History reviewed. No pertinent family history. Family Psychiatric  History: :History of depression in family, an aunt and a grandfather committed suicide  Social History:  History  Alcohol Use No     History  Drug Use No    Social History   Social History  . Marital status: Single    Spouse name: N/A  . Number of children: N/A  . Years of education: N/A   Social History Main Topics  . Smoking status:  Never Smoker  . Smokeless tobacco: Never Used  . Alcohol use No  . Drug use: No  . Sexual activity: No   Other Topics Concern  . None   Social History Narrative  . None    Hospital Course:  31 year old female, presents due to worsening depression. Reports neuro-vegetative symptoms as below.She also describes worsening anxiety, and has developed suicidal ideations of overdosing .( Did not attempt)   States she suffers from chronic depression, but that she feels she had been doing relatively well and was stable for years, while working with her outpatient psychiatrist. States that at that time she was managed on a high dose of Prozac without side effects and clear benefit. Her outpatient psychiatrist retired a few months ago, and patient states that her new psychiatrist " made some medication changes which I think have not worked ". Specifically Prozac was decreased in dose, and she was started on Vistaril and Neurontin. In the context of these changes she states she has felt progressively more depressed.  After the above admission assessment and during this hospital course, patients presenting symptoms were identified. Labs were reviewed and patients Triglycerides were elevated 408. TSH was normal, HgbA1c was elevated 9.3, and capillary glucose was closely monitored. Patient denied a history of drug or alcohol abuse. No detoxification treatments were required. Patient was treated and discharged with the following medications; Prozac to 40  mgrs QDAY for depression and anxiety and  Lamictal 25 mgrs QDAY for mood disorder, depression. She remained compliant with therapeutic milieu and actively participated in  group counseling sessions. While on the unit, patient was able to verbalize learned coping skills for better management of depression and suicidal thoughts to continue to maintain these thoughts and symptoms when returning home.  During the course of her hospitalization, improvement was monitored  by observation and [name]'s daily  report of symptom reduction, presentation of good affect, and overall improvement in mood & behavior. Besides treatment for mood stabilization, patient resumed home medications for medical conditions as noted below. It was recommended that patient continues to follow-up with her primary care physician to continue to monitor all medical conditions as appropriate. Patient did receive medication regimen for GERD with Protonix EC and she was advised to that if she continues to experience these symptoms, medications were available over the counter at her local pharmacy. Patient tolerated her treatment regimen without any adverse effects reported.  [name]'s  case was presented during treatment team meeting this morning. The team members were all in agreement that [name] was both mentally & medically stable to be discharged to continue mental health care on an outpatient basis as noted below. She was provided with all the necessary information needed to make this appointment without problems.  Upon discharge, [name] denied any SI/HI, AVH, delusional thoughts, or paranoia. She was provided with a 7 day sample  of her [facility] discharge medications. She left [facility] with all personal belongings in no apparent distress. Transportation per patients arrangement.  Physical Findings: AIMS: Facial and Oral Movements Muscles of Facial Expression: None, normal Lips and Perioral Area: None, normal Jaw: None, normal Tongue: None, normal,Extremity Movements Upper (arms, wrists, hands, fingers): None, normal Lower (legs, knees, ankles, toes): None, normal, Trunk Movements Neck, shoulders, hips: None, normal, Overall Severity Severity of abnormal movements (highest score from questions above): None, normal Incapacitation due to abnormal movements: None, normal Patient's awareness of abnormal movements (rate only patient's report): No Awareness, Dental Status Current problems with teeth  and/or dentures?: No Does patient usually wear dentures?: No  CIWA:    COWS:     Musculoskeletal: Strength & Muscle Tone: within normal limits Gait & Station: normal Patient leans: N/A  Psychiatric Specialty Exam: SEE SRA BY MD Physical Exam  Nursing note and vitals reviewed. Constitutional: She is oriented to person, place, and time. She appears well-developed.  HENT:  Head: Normocephalic.  Eyes: Pupils are equal, round, and reactive to light.  Neck: Normal range of motion.  Respiratory: Effort normal.  Neurological: She is alert and oriented to person, place, and time.  Skin: Skin is warm and dry.    Review of Systems  Psychiatric/Behavioral: Positive for depression (stable ). Negative for hallucinations, memory loss, substance abuse and suicidal ideas. The patient is nervous/anxious (stable ). The patient does not have insomnia.     Blood pressure (!) 146/94, pulse (!) 120, temperature 98 F (36.7 C), temperature source Oral, resp. rate 18, height 5\' 4"  (1.626 m), weight (!) 436 lb 3.2 oz (197.9 kg), last menstrual period 04/22/2017, SpO2 99 %.Body mass index is 74.87 kg/m.   Have you used any form of tobacco in the last 30 days? (Cigarettes, Smokeless Tobacco, Cigars, and/or Pipes): No  Has this patient used any form of tobacco in the last 30 days? (Cigarettes, Smokeless Tobacco, Cigars, and/or Pipes)  N/A  Blood Alcohol level:  No results found for: [facility]  Metabolic Disorder Labs:  Lab Results  Component Value Date   HGBA1C 9.3 (H) 05/24/2017   MPG 220 05/24/2017   Lab Results  Component Value  Date   PROLACTIN 14.0 05/24/2017   Lab Results  Component Value Date   CHOL 156 05/24/2017   TRIG 408 (H) 05/24/2017   HDL 31 (L) 05/24/2017   CHOLHDL 5.0 05/24/2017   VLDL UNABLE TO CALCULATE IF TRIGLYCERIDE OVER 400 mg/dL 40/98/1191   LDLCALC UNABLE TO CALCULATE IF TRIGLYCERIDE OVER 400 mg/dL 47/82/9562    See Psychiatric Specialty Exam and Suicide Risk Assessment  completed by Attending Physician prior to discharge.  Discharge destination:  Home  Is patient on multiple antipsychotic therapies at discharge:  No   Has Patient had three or more failed trials of antipsychotic monotherapy by history:  No  Recommended Plan for Multiple Antipsychotic Therapies: NA   Allergies as of 05/27/2017      Reactions   Shellfish Allergy Anaphylaxis      Medication List    STOP taking these medications   cholestyramine 4 g packet Commonly known as:  QUESTRAN   dicyclomine 10 MG capsule Commonly known as:  BENTYL   fluticasone 50 MCG/ACT nasal spray Commonly known as:  FLONASE   gabapentin 100 MG capsule Commonly known as:  NEURONTIN   lisinopril-hydrochlorothiazide 20-25 MG tablet Commonly known as:  PRINZIDE,ZESTORETIC   omeprazole 40 MG capsule Commonly known as:  PRILOSEC     TAKE these medications     Indication  atorvastatin 20 MG tablet Commonly known as:  LIPITOR Take 1 tablet (20 mg total) by mouth daily. For elevated triglycerides Start taking on:  05/28/2017 What changed:  additional instructions  Indication:  High Amount of Triglycerides in the Blood   FLUoxetine 40 MG capsule Commonly known as:  PROZAC Take 1 capsule (40 mg total) by mouth daily. For depression Start taking on:  05/28/2017 What changed:  additional instructions  Indication:  MDD   glimepiride 2 MG tablet Commonly known as:  AMARYL Take 1 tablet (2 mg total) by mouth daily with breakfast. Start taking on:  05/28/2017  Indication:  DM   ibuprofen 200 MG tablet Commonly known as:  ADVIL,MOTRIN Take 400 mg by mouth every 6 (six) hours as needed for moderate pain.  Indication:  Mild to Moderate Pain   insulin glargine 100 UNIT/ML injection Commonly known as:  LANTUS Inject 0.1 mLs (10 Units total) into the skin at bedtime. For Diabetes  Indication:  Diabetes   lamoTRIgine 25 MG tablet Commonly known as:  LAMICTAL Take 1 tablet (25 mg total) by mouth  daily. For mood disorder Start taking on:  05/28/2017  Indication:  mood stability   lisinopril 2.5 MG tablet Commonly known as:  PRINIVIL,ZESTRIL Take 1 tablet (2.5 mg total) by mouth daily. For high blood pressure Start taking on:  05/28/2017  Indication:  High Blood Pressure Disorder   metFORMIN 500 MG tablet Commonly known as:  GLUCOPHAGE Take 1 tablet (500 mg total) by mouth 2 (two) times daily with a meal. For diabetes  Indication:  Diabetes   metoprolol tartrate 25 MG tablet Commonly known as:  LOPRESSOR Take 1 tablet (25 mg total) by mouth 2 (two) times daily. For high blood pressure What changed:  medication strength  how much to take  additional instructions  Indication:  High Blood Pressure Disorder   multivitamin with minerals Tabs tablet Take 1 tablet by mouth daily.  Indication:  Viatmin supplement   nystatin powder Commonly known as:  MYCOSTATIN/NYSTOP Apply topically at bedtime. Nurse will provider remainder of medication used from use on the unit. Medication to be used as directed  Indication:  fungal infection   pantoprazole 40 MG tablet Commonly known as:  PROTONIX Take 2 tablets (80 mg total) by mouth daily. For acid reflux/heart burn Start taking on:  05/28/2017  Indication:  Gastroesophageal Reflux Disease   pioglitazone 15 MG tablet Commonly known as:  ACTOS Take 1 tablet (15 mg total) by mouth daily. For Diabetes Start taking on:  05/28/2017 What changed:  additional instructions  Indication:  Diabetes      Follow-up Information    [facility] Follow up on 06/01/2017.   Specialty:  Behavioral Health Why:  Assessment appointment for outpatient services at 11am with [facility]. Your medication management appointment will also be scheduled at this appointment. Please call the office if you need to cancel/reschedule this appointment. Thank you. Contact information: [address] [place] [address] [phone]            Follow-up recommendations:  Follow up with your outpatient provided for any medical issues. Activity & diet as recommended by your primary care provider.  Comments:  Patient is instructed prior to discharge to: Take all medications as prescribed by his/her mental healthcare provider. Report any adverse effects and or reactions from the medicines to his/her outpatient provider promptly. Patient has been instructed & cautioned: To not engage in alcohol and or illegal drug use while on prescription medicines. In the event of worsening symptoms, patient is instructed to call the crisis hotline, 911 and or go to the nearest ED for appropriate evaluation and treatment of symptoms. To follow-up with his/her primary care provider for your other medical issues, concerns and or health care needs.  Signed: Denzil Magnuson, NP 05/27/2017, 11:23 AM

## 2017-10-22 DIAGNOSIS — O99210 Obesity complicating pregnancy, unspecified trimester: Secondary | ICD-10-CM | POA: Insufficient documentation

## 2017-10-22 DIAGNOSIS — E669 Obesity, unspecified: Secondary | ICD-10-CM | POA: Insufficient documentation

## 2017-10-25 DIAGNOSIS — G4730 Sleep apnea, unspecified: Secondary | ICD-10-CM | POA: Insufficient documentation

## 2017-10-25 HISTORY — DX: Sleep apnea, unspecified: G47.30

## 2018-03-25 DIAGNOSIS — R0602 Shortness of breath: Secondary | ICD-10-CM

## 2018-03-25 DIAGNOSIS — J189 Pneumonia, unspecified organism: Secondary | ICD-10-CM | POA: Diagnosis not present

## 2018-03-25 DIAGNOSIS — J9601 Acute respiratory failure with hypoxia: Secondary | ICD-10-CM | POA: Diagnosis not present

## 2018-03-26 DIAGNOSIS — R0602 Shortness of breath: Secondary | ICD-10-CM | POA: Diagnosis not present

## 2018-03-26 DIAGNOSIS — J189 Pneumonia, unspecified organism: Secondary | ICD-10-CM | POA: Diagnosis not present

## 2018-03-26 DIAGNOSIS — J9601 Acute respiratory failure with hypoxia: Secondary | ICD-10-CM | POA: Diagnosis not present

## 2018-03-27 DIAGNOSIS — J189 Pneumonia, unspecified organism: Secondary | ICD-10-CM | POA: Diagnosis not present

## 2018-03-27 DIAGNOSIS — J9601 Acute respiratory failure with hypoxia: Secondary | ICD-10-CM | POA: Diagnosis not present

## 2018-03-27 DIAGNOSIS — R0602 Shortness of breath: Secondary | ICD-10-CM | POA: Diagnosis not present

## 2018-03-28 DIAGNOSIS — J9601 Acute respiratory failure with hypoxia: Secondary | ICD-10-CM | POA: Diagnosis not present

## 2018-03-28 DIAGNOSIS — R0602 Shortness of breath: Secondary | ICD-10-CM | POA: Diagnosis not present

## 2018-03-28 DIAGNOSIS — J189 Pneumonia, unspecified organism: Secondary | ICD-10-CM | POA: Diagnosis not present

## 2018-09-29 DIAGNOSIS — F329 Major depressive disorder, single episode, unspecified: Secondary | ICD-10-CM

## 2018-09-29 DIAGNOSIS — J181 Lobar pneumonia, unspecified organism: Secondary | ICD-10-CM

## 2018-09-29 DIAGNOSIS — J9601 Acute respiratory failure with hypoxia: Secondary | ICD-10-CM

## 2018-09-29 DIAGNOSIS — D72829 Elevated white blood cell count, unspecified: Secondary | ICD-10-CM

## 2018-09-29 DIAGNOSIS — E1165 Type 2 diabetes mellitus with hyperglycemia: Secondary | ICD-10-CM | POA: Diagnosis not present

## 2018-09-30 DIAGNOSIS — J181 Lobar pneumonia, unspecified organism: Secondary | ICD-10-CM | POA: Diagnosis not present

## 2018-09-30 DIAGNOSIS — J9601 Acute respiratory failure with hypoxia: Secondary | ICD-10-CM | POA: Diagnosis not present

## 2018-09-30 DIAGNOSIS — F329 Major depressive disorder, single episode, unspecified: Secondary | ICD-10-CM | POA: Diagnosis not present

## 2018-09-30 DIAGNOSIS — E1165 Type 2 diabetes mellitus with hyperglycemia: Secondary | ICD-10-CM | POA: Diagnosis not present

## 2020-09-26 DIAGNOSIS — R590 Localized enlarged lymph nodes: Secondary | ICD-10-CM

## 2020-09-26 HISTORY — DX: Localized enlarged lymph nodes: R59.0

## 2020-10-14 ENCOUNTER — Other Ambulatory Visit (HOSPITAL_COMMUNITY)
Admission: RE | Admit: 2020-10-14 | Discharge: 2020-10-14 | Disposition: A | Discharge status: Home / Self Care | Payer: Medicaid Other | Source: Ambulatory Visit | Attending: Surgery | Admitting: Surgery

## 2020-10-15 LAB — SURGICAL PATHOLOGY

## 2021-01-17 DIAGNOSIS — F4310 Post-traumatic stress disorder, unspecified: Secondary | ICD-10-CM

## 2021-01-17 DIAGNOSIS — E039 Hypothyroidism, unspecified: Secondary | ICD-10-CM

## 2021-01-17 DIAGNOSIS — R079 Chest pain, unspecified: Secondary | ICD-10-CM | POA: Insufficient documentation

## 2021-01-17 DIAGNOSIS — J309 Allergic rhinitis, unspecified: Secondary | ICD-10-CM

## 2021-01-17 DIAGNOSIS — I10 Essential (primary) hypertension: Secondary | ICD-10-CM | POA: Insufficient documentation

## 2021-01-17 DIAGNOSIS — E559 Vitamin D deficiency, unspecified: Secondary | ICD-10-CM

## 2021-01-17 DIAGNOSIS — K219 Gastro-esophageal reflux disease without esophagitis: Secondary | ICD-10-CM

## 2021-01-17 DIAGNOSIS — F3132 Bipolar disorder, current episode depressed, moderate: Secondary | ICD-10-CM

## 2021-01-17 DIAGNOSIS — G9332 Myalgic encephalomyelitis/chronic fatigue syndrome: Secondary | ICD-10-CM | POA: Insufficient documentation

## 2021-01-17 DIAGNOSIS — D531 Other megaloblastic anemias, not elsewhere classified: Secondary | ICD-10-CM

## 2021-01-17 HISTORY — DX: Essential (primary) hypertension: I10

## 2021-01-17 HISTORY — DX: Myalgic encephalomyelitis/chronic fatigue syndrome: G93.32

## 2021-01-17 HISTORY — DX: Post-traumatic stress disorder, unspecified: F43.10

## 2021-01-17 HISTORY — DX: Hypothyroidism, unspecified: E03.9

## 2021-01-17 HISTORY — DX: Allergic rhinitis, unspecified: J30.9

## 2021-01-17 HISTORY — DX: Bipolar disorder, current episode depressed, moderate: F31.32

## 2021-01-17 HISTORY — DX: Vitamin D deficiency, unspecified: E55.9

## 2021-01-17 HISTORY — DX: Other megaloblastic anemias, not elsewhere classified: D53.1

## 2021-01-17 HISTORY — DX: Gastro-esophageal reflux disease without esophagitis: K21.9

## 2021-05-09 DIAGNOSIS — Z90721 Acquired absence of ovaries, unilateral: Secondary | ICD-10-CM | POA: Insufficient documentation

## 2021-08-19 LAB — OB RESULTS CONSOLE ABO/RH: RH Type: POSITIVE

## 2021-08-19 LAB — OB RESULTS CONSOLE GC/CHLAMYDIA
Chlamydia: NEGATIVE
Gonorrhea: NEGATIVE

## 2021-08-19 LAB — OB RESULTS CONSOLE RUBELLA ANTIBODY, IGM: Rubella: IMMUNE

## 2021-08-19 LAB — OB RESULTS CONSOLE RPR: RPR: NONREACTIVE

## 2021-08-19 LAB — OB RESULTS CONSOLE HGB/HCT, BLOOD
HCT: 40 (ref 29–41)
Hemoglobin: 13.1

## 2021-08-19 LAB — OB RESULTS CONSOLE HIV ANTIBODY (ROUTINE TESTING): HIV: NONREACTIVE

## 2021-08-19 LAB — OB RESULTS CONSOLE HEPATITIS B SURFACE ANTIGEN: Hepatitis B Surface Ag: NEGATIVE

## 2021-08-19 LAB — OB RESULTS CONSOLE ANTIBODY SCREEN: Antibody Screen: NEGATIVE

## 2021-08-19 LAB — OB RESULTS CONSOLE PLATELET COUNT: Platelets: 322

## 2021-08-19 LAB — URINE CULTURE

## 2021-08-22 ENCOUNTER — Other Ambulatory Visit: Payer: Self-pay | Admitting: Obstetrics and Gynecology

## 2021-08-22 DIAGNOSIS — Z363 Encounter for antenatal screening for malformations: Secondary | ICD-10-CM

## 2021-08-26 ENCOUNTER — Encounter: Payer: Self-pay | Admitting: *Deleted

## 2021-08-28 ENCOUNTER — Ambulatory Visit: Payer: Medicaid Other

## 2021-09-02 ENCOUNTER — Other Ambulatory Visit: Payer: Self-pay

## 2021-09-02 ENCOUNTER — Encounter: Payer: Self-pay | Admitting: *Deleted

## 2021-09-02 DIAGNOSIS — E119 Type 2 diabetes mellitus without complications: Secondary | ICD-10-CM | POA: Insufficient documentation

## 2021-09-04 ENCOUNTER — Ambulatory Visit: Payer: Medicaid Other

## 2021-09-16 ENCOUNTER — Other Ambulatory Visit: Payer: Self-pay | Admitting: Obstetrics and Gynecology

## 2021-09-16 ENCOUNTER — Ambulatory Visit: Payer: Medicaid Other | Attending: Obstetrics and Gynecology

## 2021-09-16 DIAGNOSIS — O99212 Obesity complicating pregnancy, second trimester: Secondary | ICD-10-CM

## 2021-09-16 DIAGNOSIS — O24119 Pre-existing diabetes mellitus, type 2, in pregnancy, unspecified trimester: Secondary | ICD-10-CM

## 2021-09-16 DIAGNOSIS — O09212 Supervision of pregnancy with history of pre-term labor, second trimester: Secondary | ICD-10-CM | POA: Insufficient documentation

## 2021-09-16 DIAGNOSIS — O99412 Diseases of the circulatory system complicating pregnancy, second trimester: Secondary | ICD-10-CM | POA: Insufficient documentation

## 2021-09-16 DIAGNOSIS — I519 Heart disease, unspecified: Secondary | ICD-10-CM | POA: Insufficient documentation

## 2021-09-16 DIAGNOSIS — O09522 Supervision of elderly multigravida, second trimester: Secondary | ICD-10-CM

## 2021-09-16 DIAGNOSIS — O281 Abnormal biochemical finding on antenatal screening of mother: Secondary | ICD-10-CM

## 2021-09-16 DIAGNOSIS — O09219 Supervision of pregnancy with history of pre-term labor, unspecified trimester: Secondary | ICD-10-CM

## 2021-09-16 DIAGNOSIS — Z9641 Presence of insulin pump (external) (internal): Secondary | ICD-10-CM | POA: Insufficient documentation

## 2021-09-16 DIAGNOSIS — O24112 Pre-existing diabetes mellitus, type 2, in pregnancy, second trimester: Secondary | ICD-10-CM | POA: Insufficient documentation

## 2021-09-16 DIAGNOSIS — Z3A15 15 weeks gestation of pregnancy: Secondary | ICD-10-CM | POA: Insufficient documentation

## 2021-09-16 DIAGNOSIS — Z794 Long term (current) use of insulin: Secondary | ICD-10-CM | POA: Insufficient documentation

## 2021-09-16 DIAGNOSIS — O10919 Unspecified pre-existing hypertension complicating pregnancy, unspecified trimester: Secondary | ICD-10-CM

## 2021-09-16 DIAGNOSIS — O10012 Pre-existing essential hypertension complicating pregnancy, second trimester: Secondary | ICD-10-CM | POA: Insufficient documentation

## 2021-09-16 NOTE — Progress Notes (Addendum)
Referring provider:  Nigel Bridgeman Length of consultation:  45 minutes  Virtual Visit via Telephone Note  I connected with [name] on 09/16/21 at  3:30 PM EDT by telephone and verified that I am speaking with the correct person using two identifiers.  Location: Patient: home Provider: [facility]   I discussed the limitations, risks, security and privacy concerns of performing an evaluation and management service by telephone and the availability of in person appointments. I also discussed with the patient that there may be a patient responsible charge related to this service. The patient expressed understanding and agreed to proceed.  Ms.Steig was referred for genetic counseling with [facility] to review the results of her Panorama noninvasive prenatal screening (NIPS) and advanced maternal age.  The patient was seen via telephone consultation and was present on the call alone.    The results of her Panorama noninvasive prenatal screening (NIPS) through [place] showed a high risk due to insufficient fetal DNA fraction. Her blood draw resulted in a fetal fraction of <1%. Given the insufficient fetal fraction, the [facility] laboratory that identified the pregnancy as being at high risk (1 in 17, or ~6%) for triploidy, trisomy 16, and trisomy 45.      We discussed that Panorama NIPS utilizes single nucleotide polymorphisms (SNPs) to distinguish maternal from placental DNA. The term "fetal fraction" refers to the amount of sample that is believed to have come from placental DNA rather than maternal DNA. We reviewed that there are many possible reasons a sample may have a low fetal fraction, including early gestational age, high maternal BMI, suboptimal sample collection, maternal use of medications like low molecular weight heparin, pregnancy loss, pregnancy complications, and normal variation. Pregnancies affected by triploidy, trisomy 4, and trisomy 19  have all been associated with low fetal fraction on NIPS as well; however, there is no increased incidence of trisomy 66 or monosomy X in cases with low fetal fraction. It is thought that pregnancies with triploidy, trisomy 67, and trisomy 18 may have smaller placentas, which could result in an insufficient fetal fraction. We briefly reviewed that triploidy, trisomy 53, and trisomy 58 are typically lethal conditions that impact various organ systems throughout the body. Fetuses with triploidy, trisomy 79, and trisomy 85 often pass away during pregnancy; affected infants often pass away very shortly after birth, and most do not survive beyond 37 year of age.  We discussed that there were likely multiple factors that contributed to Ms. Villasenor's low fetal fraction, including her early gestational age and BMI (patient weight is 436 lb). Based on the risk estimate from [place], there is a 94% chance that the current pregnancy is not affected by triploidy, trisomy 1, or trisomy 87.  The patient will be 35 years old at the time of delivery.  We reviewed the increased risk for chromosome conditions associated with advancing maternal age.  Based upon the patient's age and current gestational age we would estimate the chance for any chromosome condition in this pregnancy to be 1 and 23 and the chance for Down syndrome specifically based upon age to be 1 and 156.  Additional screening/testing options:  We discussed additional screening and testing options that are available to attempt to clarify this high-risk NIPS result. First, it is recommended that Ms. Fitzhenry have a detailed anatomy ultrasound performed around 18-20 weeks' gestation in [facility]. She was counseled that approximately 90% of fetuses with trisomy 39, trisomy 80,  or triploidy demonstrate a sign of the respective conditions on anatomy ultrasound. She has already been scheduled for this appointment.   Second, we discussed that redrawing a new  sample for NIPS is possible. Now that she is a few weeks further along in her pregnancy, the fetal fraction may be higher. If the redraw was successful and contained a sufficient fetal fraction, results could clarify the risks for chromosomal aneuploidy in the current pregnancy. However, there is still a chance that a new sample may not contain a sufficient fetal fraction.   Finally, Ms. Eyerly was counseled regarding diagnostic testing via amniocentesis available from 16 weeks' gestation. We discussed the technical aspects of the procedure and quoted up to a 1 in 500 (0.2%) risk for spontaneous pregnancy loss or other adverse pregnancy outcomes as a result of amniocentesis. Cultured cells from an amniocentesis sample allow for the visualization of a fetal karyotype, which can detect >99% of large chromosomal aberrations. Chromosomal microarray can also be performed to identify smaller deletions or duplications of fetal chromosomal material. Ms. Mcdougald was informed that amniocentesis is the only way to determine definitively whether or not a fetus has a chromosomal abnormality in the prenatal period.   We also obtained a detailed pregnancy history and family history.  This is the fourth pregnancy for this patient, the first with her current partner.  She has an 29 year old son who was born at [redacted] weeks gestation.  He is reported to have Asperger syndrome and a possible heart defect which required no surgical correction in childhood.  He will be graduating high school this year in a normal classroom and has no other health concerns.  She also has a 37 year old daughter with ADHD who is otherwise in good health and a 56 year old son who was adopted out of the family.  Also in the family history, the patient's maternal half brother has 5 daughters, 2 of whom have autism.  Ms. Hoeppner also reported a significant maternal family history of ovarian and uterine cancer for which the patient reports she already spoke with a  cancer genetic counselor and has no further questions.  In this pregnancy, the patient reports light spotting when she has been very active and no exposure to tobacco, alcohol or recreational drugs.  She is currently taking prenatal vitamins, vitamin D and B6 supplements, omeprazole and labetalol.  She reports a history of congestive heart failure for which she has taken Lasix in the past, but is currently not on any medication for this condition.  She expressed concern about retaining fluid in her feet and difficulty breathing which she would like to speak with the MFM about tomorrow.  The father of the baby, Chrissie Noa, has 4 children who are in good health and no known family history of genetic concerns. They are not consanguinous. The remainder of the family history was unremarkable for birth defects, intellectual delays, recurrent pregnancy loss or known genetic conditions.  Regarding the family history of autism, we spoke about possible genetic as well as environmental factors and the need for an underlying cause for the condition in order to accurately determine recurrence risks for other family members.  Given her sons typical cognitive development and the fact that her brother has daughters with autism seems less likely that this may be related to fragile X syndrome.  If she would like additional information, we would recommend a formal medical genetics evaluation for her son.  Routine carrier screening for CF and SMA were also made available. Her  OB ordered prior testing for hemoglobinopathies which showed normal adult hemoglobin (AA) and an MCV of 83.   Plan of care: Ms. Pohl declined the option of repeat NIPS. Ultrasound scheduled for tomorrow at [redacted]w[redacted]d for limited views of fetal anatomy. Detailed anatomy ultrasound to be scheduled at [facility] at [redacted] weeks gestation. Ms. Shepheard declined carrier screening and amniocentesis.  She stated that she had amniocentesis in her last pregnancy and will  not consider that again. Fetal echocardiogram is recommended due to maternal diabetes and possible heart defect in her son.  We appreciate being involved in the care of this family and can be reached at [phone].  Cherly Anderson, MS, CGC    I provided 45 minutes of non-face-to-face time during this encounter.   Katrina Stack

## 2021-09-17 ENCOUNTER — Ambulatory Visit (HOSPITAL_BASED_OUTPATIENT_CLINIC_OR_DEPARTMENT_OTHER): Payer: Medicaid Other

## 2021-09-17 ENCOUNTER — Encounter: Payer: Self-pay | Admitting: *Deleted

## 2021-09-17 ENCOUNTER — Ambulatory Visit: Payer: Medicaid Other | Admitting: *Deleted

## 2021-09-17 ENCOUNTER — Ambulatory Visit (HOSPITAL_BASED_OUTPATIENT_CLINIC_OR_DEPARTMENT_OTHER): Payer: Medicaid Other | Admitting: Obstetrics

## 2021-09-17 ENCOUNTER — Ambulatory Visit: Payer: Medicaid Other

## 2021-09-17 ENCOUNTER — Other Ambulatory Visit: Payer: Self-pay

## 2021-09-17 VITALS — BP 139/66 | HR 93

## 2021-09-17 DIAGNOSIS — Z3A15 15 weeks gestation of pregnancy: Secondary | ICD-10-CM

## 2021-09-17 DIAGNOSIS — O24112 Pre-existing diabetes mellitus, type 2, in pregnancy, second trimester: Secondary | ICD-10-CM | POA: Diagnosis not present

## 2021-09-17 DIAGNOSIS — O99212 Obesity complicating pregnancy, second trimester: Secondary | ICD-10-CM

## 2021-09-17 DIAGNOSIS — O99412 Diseases of the circulatory system complicating pregnancy, second trimester: Secondary | ICD-10-CM | POA: Diagnosis not present

## 2021-09-17 DIAGNOSIS — O09522 Supervision of elderly multigravida, second trimester: Secondary | ICD-10-CM

## 2021-09-17 DIAGNOSIS — Z794 Long term (current) use of insulin: Secondary | ICD-10-CM

## 2021-09-17 DIAGNOSIS — O09212 Supervision of pregnancy with history of pre-term labor, second trimester: Secondary | ICD-10-CM | POA: Diagnosis not present

## 2021-09-17 DIAGNOSIS — O10912 Unspecified pre-existing hypertension complicating pregnancy, second trimester: Secondary | ICD-10-CM

## 2021-09-17 DIAGNOSIS — O10012 Pre-existing essential hypertension complicating pregnancy, second trimester: Secondary | ICD-10-CM | POA: Diagnosis not present

## 2021-09-17 DIAGNOSIS — O10919 Unspecified pre-existing hypertension complicating pregnancy, unspecified trimester: Secondary | ICD-10-CM

## 2021-09-17 DIAGNOSIS — O24119 Pre-existing diabetes mellitus, type 2, in pregnancy, unspecified trimester: Secondary | ICD-10-CM

## 2021-09-17 DIAGNOSIS — I519 Heart disease, unspecified: Secondary | ICD-10-CM | POA: Diagnosis not present

## 2021-09-17 DIAGNOSIS — Z9641 Presence of insulin pump (external) (internal): Secondary | ICD-10-CM | POA: Diagnosis not present

## 2021-09-17 DIAGNOSIS — O09219 Supervision of pregnancy with history of pre-term labor, unspecified trimester: Secondary | ICD-10-CM

## 2021-09-20 NOTE — Progress Notes (Signed)
MFM Note  [name] was seen for an ultrasound exam and consultation due to maternal obesity with a BMI of 68.4 (450 pounds), pregestational diabetes treated with an insulin pump, chronic hypertension treated with labetalol 100 mg twice a day, and advanced maternal age.    [name] reports that [name] was diagnosed with diabetes about 5 years ago.  [name] hemoglobin A1c drawn earlier in [name] pregnancy was 10.5%.  [name] is being treated with an OmniPod insulin pump.  [name] reports that [name] glycemic control is improving using Amy insulin pump.  [name] is scheduled to see an endocrinologist soon.    [name] was diagnosed with chronic hypertension a few years ago.  [name] reports that [name] baseline PIH labs were all within normal limits.    [name] also reports a history of congestive heart failure diagnosed in 2018.  [name] had been treated with Lasix 50 mg daily prior to pregnancy.  [name] stopped taking Lasix early in [name] current pregnancy due to fetal concerns.  [name] has noted an increase in shortness of breath and leg and feet swelling since discontinuing Amy Lasix.  [name] has an appointment to see a cardiologist in November (next month).    [name] has history of 3 prior full-term vaginal deliveries.  [name] reports that [name] first pregnancy was complicated by acute cholecystitis where [name] required hospitalization in Amy ICU for 3 weeks for treatment with antibiotics and for pain relief.  [name] cell free DNA test drawn earlier in [name] current pregnancy indicated a high risk for triploidy, trisomy 28, or trisomy 13 due to low fetal fraction (less than 1st percentile). Amy test showed no results for trisomy 21 and monosomy X. Amy fetal gender was not reported. Amy lab Avelina Laine has stated that they will accept a repeat sample.  Amy fetal biometry measurements obtained today are consistent with an [facility] of March 11, 2022.  There was normal amniotic fluid noted.  A viable singleton intrauterine pregnancy was noted.  Amy following were discussed during  our consultation today:  Pregestational diabetes and pregnancy  Amy implications and management of diabetes in pregnancy was discussed in detail with [name].  [name] was advised to continue to monitor [name] blood glucose  4 times daily (fasting and 2 hours after each meal).  [name] was advised that our goals for [name] blood glucose levels are fasting values of 90-95 or less and two-hour postprandial values of 120 or less.  [name] insulin dose may have to be increased/ adjusted throughout [name] pregnancy to help [name] achieve better glycemic control. [name] was advised that getting [name] blood glucose values as close to these goals as possible would provide [name] with Amy most optimal obstetrical outcome.  Amy increased risks of fetal congenital malformations such as heart defects and other abnormalities associated with diabetes was discussed.  A detailed fetal anatomy scan has been scheduled for [name] in our office at 19 weeks.   We will refer [name] for a fetal echocardiogram at between 21 to 22 weeks.  [name] should then be followed with monthly growth ultrasounds.  Twice weekly fetal testing using either nonstress tests or biophysical profiles should be started at around 32 weeks.  Amy increased risk of polyhydramnios, fetal macrosomia, and preeclampsia associated with diabetes was also discussed.    [name] was advised that delivery for well-controlled diabetes in pregnancy is usually recommended at around 39 weeks.  Delivery at 37 weeks may be considered should [name] glycemic control be poor.  Chronic hypertension in  pregnancy  Amy implications and management of chronic hypertension in pregnancy was discussed. [name] was advised to continue taking labetalol throughout [name] pregnancy for blood pressure control.  Amy dosage of [name] labetalol may have to be increased later in [name] pregnancy should [name] blood pressures be persistently greater than 140/90.   Amy increased risk of superimposed preeclampsia, an indicated  preterm delivery, and possible fetal growth restriction due to chronic hypertension in pregnancy was discussed.   [name] should have a baseline P/C ratio or a 24-hour urine collected to assess for total protein in [name] urine.  To decrease [name] risk of superimposed preeclampsia, [name] should start taking a daily baby aspirin (81 mg daily) for preeclampsia prophylaxis as soon as possible.  Heart failure in pregnancy  Amy cause of [name] heart failure diagnosed in 2018 remains undetermined.   I am uncertain if Amy shortness of breath and lower extremity edema that [name] is complaining of is due to worsening heart failure or related to pregnancy and [name] underlying medical conditions.    [name] needs a maternal echocardiogram to assess [name] cardiac function and cardiology evaluation as soon as possible.   I contacted someone from Amy cardiology administration at [facility] to help [name] obtain an appointment and echocardiogram ASAP.  [name] is scheduled to see Dr. Thomasene Ripple next Friday, September 26, 2021.  [name] was advised that furosemide (Lasix) may be taken during pregnancy.    We will await Amy recommendations from cardiology.  Obesity in pregnancy  [name] was advised that maternal obesity increases Amy risk of adverse pregnancy outcomes such as miscarriages/stillbirth, congenital anomalies such as neural tube defects/spina bifida, and may increase Amy risk of preeclampsia and gestational diabetes requiring an indicated preterm birth.  Amy recommended total weight gain in pregnancy for obese women's between 10 to 20 pounds.  As maternal obesity may present challenges associated with Amy management of anesthesia, an anesthesia consult should be obtained when [name] is admitted in labor.  Cell free DNA test indicating a high risk due to low fetal fraction  [name] was advised that [name] may have a redraw of [name] cell free DNA test to screen for fetal aneuploidy.  [name] declined a  redraw at this time.    [name] will consider having Amy cell free DNA test redrawn should we note any fetal anomalies during [name] fetal anatomy scan.    [name] understands that an amniocentesis is available for definitive diagnosis of fetal chromosomal abnormalities.  A detailed fetal anatomy scan has been scheduled in our office at around 19 weeks.  [name] stated that all of [name] questions have been answered to [name] satisfaction.    We will continue to follow [name] closely with you throughout [name] pregnancy.  A total of 70 minutes was spent counseling and coordinating Amy care for this Friedman.  Greater than 50% of Amy time was spent in direct face-to-face contact.  Recommendations: Echocardiogram and cardiology evaluation next week  Continue insulin for glycemic control Continue labetalol for blood pressure control Baseline P/C ratio or 24-hour urine collection to assess total protein Start daily baby aspirin (81 mg) for preeclampsia prophylaxis Detailed fetal anatomy scan at 19 weeks Fetal echocardiogram at 21 to 22 weeks Monthly growth ultrasounds Twice-weekly fetal testing starting at 32 weeks Anesthesia consult prior to delivery

## 2021-09-26 ENCOUNTER — Encounter: Payer: Self-pay | Admitting: Cardiology

## 2021-09-26 ENCOUNTER — Ambulatory Visit (INDEPENDENT_AMBULATORY_CARE_PROVIDER_SITE_OTHER): Payer: Medicaid Other | Admitting: Cardiology

## 2021-09-26 ENCOUNTER — Other Ambulatory Visit: Payer: Self-pay

## 2021-09-26 VITALS — BP 148/82 | HR 110 | Ht 66.5 in | Wt >= 6400 oz

## 2021-09-26 DIAGNOSIS — E119 Type 2 diabetes mellitus without complications: Secondary | ICD-10-CM | POA: Diagnosis not present

## 2021-09-26 DIAGNOSIS — I10 Essential (primary) hypertension: Secondary | ICD-10-CM | POA: Diagnosis not present

## 2021-09-26 DIAGNOSIS — R600 Localized edema: Secondary | ICD-10-CM

## 2021-09-26 MED ORDER — FUROSEMIDE 20 MG PO TABS: 20.0000 mg | ORAL_TABLET | Freq: Every day | ORAL | 3 refills | Status: AC

## 2021-09-26 NOTE — Progress Notes (Signed)
Cardio-Obstetrics Clinic  New Evaluation  Date:  10/03/2021   ID:  [name], DOB February 13, 1986, MRN [id number]  PCP:  Bonnita Nasuti, MD   [facility] Providers Cardiologist:  Berniece Salines, DO  Electrophysiologist:  None       Referring MD: Donnel Saxon, CNM   Chief Complaint: " I am really short of breath"  History of Present Illness:    [name] is a 35 y.o. female [[id number]] who is being seen today for the evaluation of shortness of breath at the request of Donnel Saxon, [place].   She is currently 16 weeks and 2 days. She has a medical history of diabetes mellitus recent A1c is 9.8, hypertension, hyperlipidemia , diastolic heart failure with exacerbation during the post partum in 2019 and obesity. She is here today to establish cardiac care.   She tells me that since she has experiencing shortness of breath on exertion and even at rest. Her lasix was discontinued by her pcp after she got pregnant.   Prior CV Studies Reviewed: The following studies were reviewed today: Echo done at [facility] in 03/2018 showed normal ejection fraction EF 55-60%. With no valvular abnormalities.   Past Medical History:  Diagnosis Date   Anxiety    Bipolar disorder ([place])    Depression    Diabetes mellitus without complication (HCC)    GERD (gastroesophageal reflux disease)    Heart failure (HCC)    HPV (human papilloma virus) infection    Hypercholesteremia    Hypertension    Ovarian cyst    Vaginal Pap smear, abnormal     Past Surgical History:  Procedure Laterality Date   CHOLECYSTECTOMY     SALPINGECTOMY Left    Benign growth      OB History     Gravida  4   Para  3   Term  2   Preterm  1   AB      Living  3      SAB      IAB      Ectopic      Multiple      Live Births  2               Current Medications: Current Meds  Medication Sig   aspirin EC 81 MG tablet Take 81 mg by mouth daily. Swallow whole.    furosemide (LASIX) 20 MG tablet Take 1 tablet (20 mg total) by mouth daily.   insulin aspart (NOVOLOG) 100 UNIT/ML injection Inject into the skin 3 (three) times daily before meals.   Insulin Disposable Pump (OMNIPOD 5 G6 INTRO, GEN 5,) KIT by Does not apply route.   insulin glargine (LANTUS) 100 UNIT/ML injection Inject 0.1 mLs (10 Units total) into the skin at bedtime. For Diabetes   labetalol (NORMODYNE) 100 MG tablet Take 100 mg by mouth 2 (two) times daily.   omeprazole (PRILOSEC) 40 MG capsule Take 40 mg by mouth daily.     Allergies:   Shellfish allergy   Social History   Socioeconomic History   Marital status: Single    Spouse name: Not on file   Number of children: Not on file   Years of education: Not on file   Highest education level: Not on file  Occupational History   Not on file  Tobacco Use   Smoking status: Never   Smokeless tobacco: Never  Vaping Use   Vaping Use: Never used  Substance and Sexual Activity  Alcohol use: No   Drug use: No   Sexual activity: Never  Other Topics Concern   Not on file  Social History Narrative   Not on file   Social Determinants of Health   Financial Resource Strain: Not on file  Food Insecurity: Not on file  Transportation Needs: Not on file  Physical Activity: Not on file  Stress: Not on file  Social Connections: Not on file      Family History  Problem Relation Age of Onset   Heart disease Mother    COPD Mother    Diabetes Mother    Depression Mother    Heart disease Son    Asthma Son       ROS:   Please see the history of present illness.     Reports shortness of breath but other  systems reviewed and are negative.   Labs/EKG Reviewed:    EKG:   EKG is was ordered today.  The ekg ordered today demonstrates sinus tachycardia.  Recent Labs: No results found for requested labs within last 8760 hours.   Recent Lipid Panel Lab Results  Component Value Date/Time   CHOL 156 05/24/2017 06:10 AM   TRIG  408 (H) 05/24/2017 06:10 AM   HDL 31 (L) 05/24/2017 06:10 AM   CHOLHDL 5.0 05/24/2017 06:10 AM   LDLCALC UNABLE TO CALCULATE IF TRIGLYCERIDE OVER 400 mg/dL 05/24/2017 06:10 AM    Physical Exam:    VS:  BP (!) 148/82   Pulse (!) 110   Ht 5' 6.5" (1.689 m)   Wt (!) 448 lb 12.8 oz (203.6 kg)   LMP 06/04/2021   SpO2 98%   BMI 71.35 kg/m     Wt Readings from Last 3 Encounters:  09/26/21 (!) 448 lb 12.8 oz (203.6 kg)     GEN:  Well nourished, well developed in no acute distress HEENT: Normal NECK: No JVD; No carotid bruits LYMPHATICS: No lymphadenopathy CARDIAC: RRR, no murmurs, rubs, gallops RESPIRATORY:  Clear to auscultation without rales, wheezing or rhonchi  ABDOMEN: Soft, non-tender, non-distended MUSCULOSKELETAL:  +1 bilateral leg edema; No deformity  SKIN: Warm and dry NEUROLOGIC:  Alert and oriented x 3 PSYCHIATRIC:  Normal affect    Risk Assessment/Risk Calculators:     CARPREG II Risk Prediction Index Score:  1.  The patient's risk for a primary cardiac event is 5%.   Modified World Health Organization [facility]) Classification of Maternal CV Risk   Class I         ASSESSMENT & PLAN:    Chronic Diastolic Heart failure Shortness of breath Chronic hypertension in pregnancy Diabetes Mellitus Morbid obesity  She has been off her diuretics.  Held Lasix 20 mg daily like to restart his medication.  I also think that will be beneficial for the patient to be evaluated by endocrinology.  We will set the patient up for diabetes education by our pharmacist. I counseled the patient that it will be beneficial that she keep her weight gain between 11 and 20 pounds.  No statin/no acei during pregnancy.  The patient is in agreement with the above plan. The patient left the office in stable condition.  The patient will follow up in 8 weeks or sooner if needed.  Patient Instructions  Medication Instructions:  Your physician has recommended you make the following change in  your medication:  START: Lasix 20 mg daily *If you need a refill on your cardiac medications before your next appointment, please call your pharmacy*  Lab Work: None If you have labs (blood work) drawn today and your tests are completely normal, you will receive your results only by: [place] (if you have MyChart) OR A paper copy in the mail If you have any lab test that is abnormal or we need to change your treatment, we will call you to review the results.   Testing/Procedures: None   Follow-Up: At [facility], you and your health needs are our priority.  As part of our continuing mission to provide you with exceptional heart care, we have created designated Provider Care Teams.  These Care Teams include your primary Cardiologist (physician) and Advanced Practice Providers (APPs -  Physician Assistants and Nurse Practitioners) who all work together to provide you with the care you need, when you need it.  We recommend signing up for the patient portal called "MyChart".  Sign up information is provided on this After Visit Summary.  MyChart is used to connect with patients for Virtual Visits (Telemedicine).  Patients are able to view lab/test results, encounter notes, upcoming appointments, etc.  Non-urgent messages can be sent to your provider as well.   To learn more about what you can do with MyChart, go to [email or web address].    Your next appointment:   8 week(s)  The format for your next appointment:   In Person  Provider:   Berniece Salines  [facility] [address], [place], [place] [address]    Other Instructions     Dispo:  No follow-ups on file.   Medication Adjustments/Labs and Tests Ordered: Current medicines are reviewed at length with the patient today.  Concerns regarding medicines are outlined above.  Tests Ordered: Orders Placed This Encounter  Procedures   Ambulatory referral to Endocrinology   AMB Referral to [facility] Pharm-D    EKG 12-Lead   Medication Changes: Meds ordered this encounter  Medications   furosemide (LASIX) 20 MG tablet    Sig: Take 1 tablet (20 mg total) by mouth daily.    Dispense:  90 tablet    Refill:  3

## 2021-09-26 NOTE — Patient Instructions (Signed)
Medication Instructions:  Your physician has recommended you make the following change in your medication:  START: Lasix 20 mg daily *If you need a refill on your cardiac medications before your next appointment, please call your pharmacy*   Lab Work: None If you have labs (blood work) drawn today and your tests are completely normal, you will receive your results only by: MyChart Message (if you have MyChart) OR A paper copy in the mail If you have any lab test that is abnormal or we need to change your treatment, we will call you to review the results.   Testing/Procedures: None   Follow-Up: At [facility], you and your health needs are our priority.  As part of our continuing mission to provide you with exceptional heart care, we have created designated Provider Care Teams.  These Care Teams include your primary Cardiologist (physician) and Advanced Practice Providers (APPs -  Physician Assistants and Nurse Practitioners) who all work together to provide you with the care you need, when you need it.  We recommend signing up for the patient portal called "MyChart".  Sign up information is provided on this After Visit Summary.  MyChart is used to connect with patients for Virtual Visits (Telemedicine).  Patients are able to view lab/test results, encounter notes, upcoming appointments, etc.  Non-urgent messages can be sent to your provider as well.   To learn more about what you can do with MyChart, go to [email or web address].    Your next appointment:   8 week(s)  The format for your next appointment:   In Person  Provider:   Thomasene Ripple  [facility] [address], [place], [place] [address]    Other Instructions

## 2021-10-01 ENCOUNTER — Telehealth: Payer: Self-pay

## 2021-10-01 NOTE — Telephone Encounter (Signed)
Called and attempted to schedule a pharmd appt but had to lmom pt

## 2021-10-03 ENCOUNTER — Telehealth: Payer: Self-pay | Admitting: Cardiology

## 2021-10-03 NOTE — Telephone Encounter (Signed)
Called to schedule PharmD referral and patient stated that Dr. Servando Salina was supposed to be helping her get transportation since she doesn't always have a ride. Do we have any update on this? Patient did not want to schedule until she knew she could get transportation. Please advise.

## 2021-10-03 NOTE — Telephone Encounter (Signed)
Left message for patient .   Usually patient should make an appointment ,first and then we can get  social work  involved to arrange transportation. Will send to Dr Mallory Shirk nurse.

## 2021-10-07 NOTE — Telephone Encounter (Signed)
I reached out to the transportation services to ask where the patient is with the application at this time. Still waiting for an answer.

## 2021-10-09 NOTE — Telephone Encounter (Signed)
Appointment scheduled for 11/10 at 3:00

## 2021-10-13 ENCOUNTER — Ambulatory Visit: Payer: Medicaid Other | Admitting: Cardiology

## 2021-10-15 ENCOUNTER — Other Ambulatory Visit: Payer: Self-pay | Admitting: *Deleted

## 2021-10-15 ENCOUNTER — Ambulatory Visit: Payer: Medicaid Other | Admitting: *Deleted

## 2021-10-15 ENCOUNTER — Other Ambulatory Visit: Payer: Self-pay

## 2021-10-15 ENCOUNTER — Ambulatory Visit: Payer: Medicaid Other | Attending: Obstetrics and Gynecology

## 2021-10-15 DIAGNOSIS — Z363 Encounter for antenatal screening for malformations: Secondary | ICD-10-CM | POA: Diagnosis present

## 2021-10-15 DIAGNOSIS — O24912 Unspecified diabetes mellitus in pregnancy, second trimester: Secondary | ICD-10-CM

## 2021-10-15 DIAGNOSIS — O10912 Unspecified pre-existing hypertension complicating pregnancy, second trimester: Secondary | ICD-10-CM

## 2021-10-15 NOTE — Progress Notes (Unsigned)
Us/

## 2021-10-16 ENCOUNTER — Ambulatory Visit: Payer: Medicaid Other

## 2021-10-17 ENCOUNTER — Telehealth: Payer: Self-pay

## 2021-10-17 NOTE — Telephone Encounter (Signed)
Received a call from [place] at [facility] - per Nigel Bridgeman - future reports need to go to [facility] due to transfer of care there.

## 2021-10-20 ENCOUNTER — Encounter: Payer: Self-pay | Admitting: *Deleted

## 2021-10-20 DIAGNOSIS — O0990 Supervision of high risk pregnancy, unspecified, unspecified trimester: Secondary | ICD-10-CM | POA: Insufficient documentation

## 2021-10-20 DIAGNOSIS — F3289 Other specified depressive episodes: Secondary | ICD-10-CM

## 2021-10-20 DIAGNOSIS — I509 Heart failure, unspecified: Secondary | ICD-10-CM

## 2021-10-20 DIAGNOSIS — N83209 Unspecified ovarian cyst, unspecified side: Secondary | ICD-10-CM | POA: Insufficient documentation

## 2021-10-20 DIAGNOSIS — E119 Type 2 diabetes mellitus without complications: Secondary | ICD-10-CM

## 2021-10-20 DIAGNOSIS — E7800 Pure hypercholesterolemia, unspecified: Secondary | ICD-10-CM

## 2021-10-20 DIAGNOSIS — O10919 Unspecified pre-existing hypertension complicating pregnancy, unspecified trimester: Secondary | ICD-10-CM | POA: Insufficient documentation

## 2021-10-20 DIAGNOSIS — O2340 Unspecified infection of urinary tract in pregnancy, unspecified trimester: Secondary | ICD-10-CM | POA: Insufficient documentation

## 2021-10-20 DIAGNOSIS — F419 Anxiety disorder, unspecified: Secondary | ICD-10-CM

## 2021-10-20 DIAGNOSIS — I5032 Chronic diastolic (congestive) heart failure: Secondary | ICD-10-CM | POA: Insufficient documentation

## 2021-10-20 DIAGNOSIS — O3660X0 Maternal care for excessive fetal growth, unspecified trimester, not applicable or unspecified: Secondary | ICD-10-CM | POA: Insufficient documentation

## 2021-10-20 DIAGNOSIS — F32A Depression, unspecified: Secondary | ICD-10-CM | POA: Insufficient documentation

## 2021-10-20 DIAGNOSIS — O119 Pre-existing hypertension with pre-eclampsia, unspecified trimester: Secondary | ICD-10-CM | POA: Insufficient documentation

## 2021-10-20 HISTORY — DX: Chronic diastolic (congestive) heart failure: I50.32

## 2021-10-21 ENCOUNTER — Telehealth: Payer: Self-pay | Admitting: Cardiology

## 2021-10-21 ENCOUNTER — Telehealth: Payer: Self-pay

## 2021-10-21 NOTE — Telephone Encounter (Signed)
Called pt to see how she is taking her Lasix. No answer, left message for her to return the call.   

## 2021-10-21 NOTE — Telephone Encounter (Signed)
Received a call from patient she stated her sob is worse today.Stated she is sob walking from room to room.Hard to get a good breath.She has a frequent dry hacky cough.Increased swelling in both lower legs and feet.She has not weighed.She feels bloated.Stated she is [redacted] weeks pregnant.Stated Lasix was decreased to 20 mg daily.Advised she needs to go to ED to be evaluated.I will make Dr.Tobb aware.

## 2021-10-21 NOTE — Telephone Encounter (Signed)
Pt c/o Shortness Of Breath: STAT if SOB developed within the last 24 hours or pt is noticeably SOB on the phone  1. Are you currently SOB (can you hear that pt is SOB on the phone)? Yes   2. How long have you been experiencing SOB? 3 days  3. Are you SOB when sitting or when up moving around? SOB  4. Are you currently experiencing any other symptoms? Dizziness, lightheadedness, keep coughing

## 2021-10-21 NOTE — Telephone Encounter (Signed)
Called pt to see how she is taking her Lasix. No answer, left message for her to return the call.

## 2021-10-22 DIAGNOSIS — I509 Heart failure, unspecified: Secondary | ICD-10-CM | POA: Insufficient documentation

## 2021-10-22 NOTE — Telephone Encounter (Signed)
Patient returned call. She states she went to [facility] last night like advised by this office. "They said I am in heart failure but no fluid around my heart. I do have fluid in my lungs though. They sent me to [facility] ED. I am about to be admitted to the cardiac unit for IV Lasix I think. I am on 4-5 liters of oxygen consistently right now. They said I will probably have to follow up with Dr. Servando Salina when I get discharged. I just wanted to let Dr. Servando Salina know what was going on. I don't know if she wants to talk to them or not." Patient made aware I will relay thi message to Dr. Servando Salina and told her to reach out when she is discharged so we can add her to the schedule. She verbalized understanding.

## 2021-10-22 NOTE — Telephone Encounter (Signed)
Left message for patient to return the call.

## 2021-10-27 ENCOUNTER — Telehealth: Payer: Self-pay | Admitting: Pharmacist

## 2021-10-27 NOTE — Telephone Encounter (Signed)
Patient called.  Having car problems and has rescheduled her first appointment from tomorrow until 11/04/21

## 2021-10-28 ENCOUNTER — Ambulatory Visit: Payer: Medicaid Other

## 2021-11-04 ENCOUNTER — Ambulatory Visit: Payer: Medicaid Other

## 2021-11-04 NOTE — Telephone Encounter (Signed)
Update:  patient was a no show for appointment today

## 2021-11-07 ENCOUNTER — Encounter: Payer: Medicaid Other | Admitting: Family Medicine

## 2021-11-07 ENCOUNTER — Encounter: Payer: Self-pay | Admitting: Family Medicine

## 2021-11-07 DIAGNOSIS — Z8751 Personal history of pre-term labor: Secondary | ICD-10-CM

## 2021-11-07 DIAGNOSIS — R8271 Bacteriuria: Secondary | ICD-10-CM | POA: Insufficient documentation

## 2021-11-07 DIAGNOSIS — O285 Abnormal chromosomal and genetic finding on antenatal screening of mother: Secondary | ICD-10-CM | POA: Insufficient documentation

## 2021-11-07 HISTORY — DX: Personal history of pre-term labor: Z87.51

## 2021-11-07 NOTE — Progress Notes (Deleted)
Subjective:   [name] is a 35 y.o. [phone] at 31w2dby LMP c/w 15 wk [place] seen today for her first obstetrical visit.  Her obstetrical history is significant for  cHTN on labetalol, HFpEF with recent hospital admission on 10/21/2021 for CHF exacerbation on chronic diuretics, T2DM on insulin pump, abnormal NIPS . Patient {does/does not:19097} intend to breast feed. Pregnancy history fully reviewed.  Patient transferring from [facility] due to high risk pregnancy.   Patient reports {sx:14538}.  HISTORY: OB History  Gravida Para Term Preterm AB Living  4 3 2 1  0 3  SAB IAB Ectopic Multiple Live Births  0 0 0 0 2    # Outcome Date GA Lbr Len/2nd Weight Sex Delivery Anes PTL Lv  4 Current           3 Term 11/20/07 425w0d9 lb 5 oz (4.224 kg)  Vag-Spont  Y      Birth Comments: BUFA  2 Term 06/30/05 4160w0d lb 9 oz (3.43 kg)  Vag-Spont   LIV  1 Preterm 10/06/03 36w65w0dlb 14 oz (3.118 kg)  Vag-Spont   LIV     Last pap smear: No results found for: DIAGPAP, HPV, HPV[place]  Past Medical History:  Diagnosis Date   Anxiety    Bipolar disorder (HCC)[place] Depression    Diabetes mellitus without complication (HCC)    GERD (gastroesophageal reflux disease)    Heart failure (HCC)    HPV (human papilloma virus) infection    Hypercholesteremia    Hypertension    Ovarian cyst    Vaginal Pap smear, abnormal    Past Surgical History:  Procedure Laterality Date   CHOLECYSTECTOMY     SALPINGECTOMY Left    Benign growth   Family History  Problem Relation Age of Onset   Heart disease Mother    COPD Mother    Diabetes Mother    Depression Mother    Heart disease Son    Asthma Son    Social History   Tobacco Use   Smoking status: Never   Smokeless tobacco: Never  Vaping Use   Vaping Use: Never used  Substance Use Topics   Alcohol use: No   Drug use: No   Allergies  Allergen Reactions   Shellfish Allergy Anaphylaxis   Current Outpatient Medications  on File Prior to Visit  Medication Sig Dispense Refill   aspirin EC 81 MG tablet Take 81 mg by mouth daily. Swallow whole.     calcium-vitamin D (OSCAL WITH D) 500-200 MG-UNIT tablet Take 1 tablet by mouth. (Patient not taking: No sig reported)     FLUoxetine (PROZAC) 40 MG capsule Take 1 capsule (40 mg total) by mouth daily. For depression (Patient not taking: No sig reported) 40 capsule 0   furosemide (LASIX) 20 MG tablet Take 1 tablet (20 mg total) by mouth daily. 90 tablet 3   glimepiride (AMARYL) 2 MG tablet Take 1 tablet (2 mg total) by mouth daily with breakfast. (Patient not taking: No sig reported) 30 tablet 0   ibuprofen (ADVIL,MOTRIN) 200 MG tablet Take 400 mg by mouth every 6 (six) hours as needed for moderate pain. (Patient not taking: No sig reported)     insulin aspart (NOVOLOG) 100 UNIT/ML injection Inject into the skin 3 (three) times daily before meals.     Insulin Disposable Pump (OMNIPOD 5 G6 INTRO, GEN 5,) KIT by Does not apply  route.     insulin glargine (LANTUS) 100 UNIT/ML injection Inject 0.1 mLs (10 Units total) into the skin at bedtime. For Diabetes 10 mL 11   labetalol (NORMODYNE) 100 MG tablet Take 100 mg by mouth 2 (two) times daily.     lamoTRIgine (LAMICTAL) 25 MG tablet Take 1 tablet (25 mg total) by mouth daily. For mood disorder (Patient not taking: No sig reported) 30 tablet 0   metFORMIN (GLUCOPHAGE) 500 MG tablet Take 1 tablet (500 mg total) by mouth 2 (two) times daily with a meal. For diabetes (Patient not taking: No sig reported) 60 tablet 0   metoprolol tartrate (LOPRESSOR) 25 MG tablet Take 1 tablet (25 mg total) by mouth 2 (two) times daily. For high blood pressure (Patient not taking: No sig reported) 60 tablet 0   Multiple Vitamin (MULTIVITAMIN WITH MINERALS) TABS tablet Take 1 tablet by mouth daily.     nystatin (MYCOSTATIN/NYSTOP) powder Apply topically at bedtime. Nurse will provider remainder of medication used from use on the unit. Medication to be  used as directed (Patient not taking: No sig reported) 15 g 0   omeprazole (PRILOSEC) 40 MG capsule Take 40 mg by mouth daily.     pantoprazole (PROTONIX) 40 MG tablet Take 2 tablets (80 mg total) by mouth daily. For acid reflux/heart burn (Patient not taking: No sig reported) 60 tablet 0   pioglitazone (ACTOS) 15 MG tablet Take 1 tablet (15 mg total) by mouth daily. For Diabetes (Patient not taking: No sig reported) 30 tablet 0   pyridoxine (B-6) 200 MG tablet Take 200 mg by mouth daily.     No current facility-administered medications on file prior to visit.     Exam   There were no vitals filed for this visit.    {New OB Pap vs No Pap Exam:26314}    Assessment:   Pregnancy: [id number] Patient Active Problem List   Diagnosis Date Noted   Supervision of high risk pregnancy, antepartum 10/20/2021   Chronic heart failure with preserved ejection fraction (HFpEF) ([place]) 10/20/2021   Chronic hypertension affecting pregnancy 10/20/2021   UTI in pregnancy 10/20/2021   Hypercholesteremia    Anxiety    Depression    Ovarian cyst    LGA (large for gestational age) fetus affecting management of mother    Diabetes mellitus without complication ([place]) 00/92/3300   Severe recurrent major depression without psychotic features ([place]) 05/23/2017     Plan:  1. Supervision of high risk pregnancy, antepartum Initial labs reviewed from [facility], notable for elevated A1c and abnormal NIPS Continue prenatal vitamins. Genetic Screening results reviewed, see below. Ultrasound results reviewed, following w MFM. Problem list reviewed and updated. The nature of [facility] with multiple MDs and other Advanced Practice Providers was explained to patient; also emphasized that residents, students are part of our team.***  2. Chronic heart failure with preserved ejection fraction (HFpEF) ([place]) Diagnosed in 2018, unclear circumstances*** Recently admitted to [facility] for CHF  exacerbation on 10/21/2021, received several doses of lasix and improved Thought to be secondary to recent discontinuation of lasix followed by lower inadequate dose Discharged on lasix 58m BID EF at that time 50-55% with mild LA dilation, otherwise no abnormalities Saw Cardio OB clinic on 09/26/2021, will continue to follow with them  3. Chronic hypertension affecting pregnancy On Labetalol *** mg BID BP today is *** P:C at new OB was 2.69, during recent admission was 0.8, CBC/CMP baselines were unremarkable On ASA  4. Diabetes mellitus without complication (HCC) Being followed by *** for insulin pump management Current settings *** Last A1c during recent admission was 8.1% Referred for fetal echo but has not yet obtained, *** ***  5. Severe recurrent major depression without psychotic features (HCC) ***  6. Abnormal genetic test during pregnancy Abnormal Panorama with high risk for trisomy 13/18 due to low fetal fraction Seen by MFM and genetics, declined redraw and amniocentesis Per genetic counselor overall low risk of genetic defects based on results No fetal anomalies noted to date  76. GBS bacteriuria Noted on new OB labs  8. History of preterm delivery At 51 weeks Declined Makena at [facility], prescribed vaginal prometrium ***   Routine obstetric precautions reviewed. No follow-ups on file.

## 2021-11-13 ENCOUNTER — Ambulatory Visit: Payer: Medicaid Other

## 2021-11-18 ENCOUNTER — Other Ambulatory Visit: Payer: Self-pay

## 2021-11-21 ENCOUNTER — Ambulatory Visit: Payer: Medicaid Other | Admitting: Cardiology

## 2021-11-24 ENCOUNTER — Ambulatory Visit: Payer: Medicaid Other

## 2021-11-27 ENCOUNTER — Ambulatory Visit: Payer: Medicaid Other

## 2021-11-27 DIAGNOSIS — B3731 Acute candidiasis of vulva and vagina: Secondary | ICD-10-CM | POA: Insufficient documentation

## 2021-12-07 NOTE — L&D Delivery Note (Signed)
OB/GYN Faculty Practice Delivery Note ? ?[name] is a 36 y.o. [phone] s/p low FAVD at [redacted]w[redacted]d. She was admitted for Super-imposed Preeclampsia with severe features (HA).  ? ?ROM: 12h 79m with clear fluid ?GBS Status: positive in her urine   ?Maximum Maternal Temperature:  ? ? ?Labor Progress: ?[name] had a normal labor course with normal progression of cervical dilation. She had a foley bulb placed along with several rounds of cytotec. The foley bulb came out and then she had AROM done at 1117pm along with FSE and IUPC placement. She became completely dilated at 7am but she did not desire to push until the head was lower. Around 11am, she felt the urge to push.  ? ?Delivery Date/Time: 11:52 on 02/07/22 ?Delivery: Called to room and patient was complete and pushing but having variable decels with pushing. Her CNM felt an operative delivery would be indicated. After assessing the patient, fetal position and FHT, I agreed with this assessment.  ? ?I counseled her and spouse on the risks and benefits of an operative vaginal delivery and recommended forceps based on station of +2. I reviewed if unable to place the forceps, I would recommend attempted VAVD. I reviewed the risks of FAVD. She consented to them for the indication of maternal exhaustion and NRFHT. NICU and anesthesia were present for delivery prior to application of forceps.  ? ?The head was noted to be at +2 station in DOP position. Minimal caput noted and no molding. Epidural was sufficient for anesthesia.  Bladder emptied by foley and foley was removed. IUPC was removed. Due to concern about FHT, I left the FSE on. Luikart forceps placed with ease with the anterior blade placed first and the posterior was placed easily. Blades interlocked appropriately. Position confirmed in the correct place relative to the fontanelle and sagittal suture. Then with maternal effort, downward traction applied with the forceps following the curvature of the  pelvis. She would rest between pushes and I would stop applying traction. There was excellent and quick descent with her effort such that she pushed with 2 contractions and the head was delivered. As the head crowned, the forceps were removed easily.  ? ?Head delivered DOA. No nuchal noted. Shoulders delivered easily.  Infant to mother's abdomen for skin-to-skin. Cord clamped and baby was handed to awaiting pediatrician and team after one minute. Pitocin started per protocol. TXA given as well. ? ?Complete normal placenta delivered spontaneously. Liletta IUD was placed with the applicator per manufacturer instructions. Strings were trimmed.  ? ?Perineum, vagina and cervix inspected. 2nd degree laceration repaired in usual fashion with 3-0 vicryl. Foley catheter replaced.  ? ?Baby Weight: pending ? ?Placenta: Sent to pathology ?Complications: None ?Lacerations: 2nd degree ?EBL: 450 mL ?Analgesia: Epidural  ? ?Infant:  ?APGAR (1 MIN): 8   ?APGAR (5 MINS): 8   ?APGAR (10 MINS):    ? ?

## 2021-12-10 ENCOUNTER — Telehealth: Payer: Self-pay

## 2021-12-10 NOTE — Telephone Encounter (Signed)
Called pt to check in and see if she is still following with [place] [facility] [place], no answer at this time. Left message for her to return the call.

## 2021-12-12 ENCOUNTER — Telehealth: Payer: Self-pay | Admitting: *Deleted

## 2021-12-12 NOTE — Telephone Encounter (Signed)
Called pt @ request of Dr. Kennon Rounds in order to determine if pt is receiving prenatal care. Per messages Dr. Kennon Rounds has received, pt was referred to our office by [facility] however has not been seen. Pt had New Ob appt scheduled on 12/2 however did not show for the appointment. I left a message for pt on her personal voicemail. I requested her to call back and leave message on nurse voicemail to let us know if she is receiving prenatal care elsewhere or if she would like to reschedule her appointment in our office. Dr. Kennon Rounds will be notified once information is received form the pt.

## 2021-12-15 ENCOUNTER — Ambulatory Visit: Payer: Medicaid Other | Admitting: *Deleted

## 2021-12-15 ENCOUNTER — Other Ambulatory Visit: Payer: Self-pay

## 2021-12-15 ENCOUNTER — Ambulatory Visit: Payer: Medicaid Other | Attending: Obstetrics

## 2021-12-15 ENCOUNTER — Encounter: Payer: Self-pay | Admitting: *Deleted

## 2021-12-15 VITALS — BP 131/64 | HR 101

## 2021-12-15 DIAGNOSIS — I5032 Chronic diastolic (congestive) heart failure: Secondary | ICD-10-CM

## 2021-12-15 DIAGNOSIS — O0990 Supervision of high risk pregnancy, unspecified, unspecified trimester: Secondary | ICD-10-CM

## 2021-12-15 DIAGNOSIS — O24912 Unspecified diabetes mellitus in pregnancy, second trimester: Secondary | ICD-10-CM | POA: Diagnosis present

## 2021-12-15 DIAGNOSIS — E7800 Pure hypercholesterolemia, unspecified: Secondary | ICD-10-CM | POA: Insufficient documentation

## 2021-12-15 DIAGNOSIS — O10919 Unspecified pre-existing hypertension complicating pregnancy, unspecified trimester: Secondary | ICD-10-CM | POA: Insufficient documentation

## 2021-12-15 DIAGNOSIS — O10912 Unspecified pre-existing hypertension complicating pregnancy, second trimester: Secondary | ICD-10-CM | POA: Diagnosis present

## 2021-12-15 DIAGNOSIS — F419 Anxiety disorder, unspecified: Secondary | ICD-10-CM

## 2021-12-17 NOTE — Telephone Encounter (Signed)
Called and spoke with patient and she reports she forgot her appointment that was scheduled with [facility] as she has so many doctors and appointments. She reports she is to be seen in " the high risk clinic". Reviewed with her that she was referred to this office , [facility].  and needs to reschedule her appointment.She would like to get rescheduled.  Informed her I will send message to front desk to call her and reschedule her appointment. Patient voiced understanding.

## 2021-12-26 ENCOUNTER — Ambulatory Visit: Payer: Medicaid Other

## 2021-12-26 ENCOUNTER — Telehealth: Payer: Self-pay | Admitting: Pharmacist

## 2021-12-26 NOTE — Telephone Encounter (Signed)
Wanted to give you a heads up.  Patient was a no show again for appointment today.

## 2021-12-29 ENCOUNTER — Encounter: Payer: Self-pay | Admitting: Obstetrics and Gynecology

## 2021-12-29 ENCOUNTER — Telehealth: Payer: Self-pay

## 2021-12-29 DIAGNOSIS — R609 Edema, unspecified: Secondary | ICD-10-CM

## 2021-12-29 DIAGNOSIS — R0600 Dyspnea, unspecified: Secondary | ICD-10-CM

## 2021-12-29 DIAGNOSIS — Z0000 Encounter for general adult medical examination without abnormal findings: Secondary | ICD-10-CM | POA: Insufficient documentation

## 2021-12-29 HISTORY — DX: Dyspnea, unspecified: R06.00

## 2021-12-29 HISTORY — DX: Edema, unspecified: R60.9

## 2021-12-29 NOTE — Telephone Encounter (Signed)
Called pt to inquire why she missed her appt on Friday. She states "we were having car trouble, we finally just broke down and get something a little newer." When asked did she want to reschedule with pharmacy she states "well I'll see what Dr. Harriet Masson says and if she still wants me to see them then I will" Pt informed Dr. Harriet Masson still wants her to see the pharmacy staff, she was added to the schedule for 2/8. Will contact them to see if pt can be seen sooner.

## 2021-12-31 ENCOUNTER — Ambulatory Visit (INDEPENDENT_AMBULATORY_CARE_PROVIDER_SITE_OTHER): Payer: Medicaid Other | Admitting: Obstetrics and Gynecology

## 2021-12-31 ENCOUNTER — Other Ambulatory Visit: Payer: Self-pay

## 2021-12-31 ENCOUNTER — Encounter: Payer: Self-pay | Admitting: Obstetrics and Gynecology

## 2021-12-31 VITALS — BP 146/77 | HR 105 | Wt >= 6400 oz

## 2021-12-31 DIAGNOSIS — O10919 Unspecified pre-existing hypertension complicating pregnancy, unspecified trimester: Secondary | ICD-10-CM | POA: Diagnosis not present

## 2021-12-31 DIAGNOSIS — R8271 Bacteriuria: Secondary | ICD-10-CM

## 2021-12-31 DIAGNOSIS — K219 Gastro-esophageal reflux disease without esophagitis: Secondary | ICD-10-CM

## 2021-12-31 DIAGNOSIS — I5032 Chronic diastolic (congestive) heart failure: Secondary | ICD-10-CM

## 2021-12-31 DIAGNOSIS — G4730 Sleep apnea, unspecified: Secondary | ICD-10-CM

## 2021-12-31 DIAGNOSIS — Z23 Encounter for immunization: Secondary | ICD-10-CM | POA: Diagnosis not present

## 2021-12-31 DIAGNOSIS — Z8751 Personal history of pre-term labor: Secondary | ICD-10-CM

## 2021-12-31 DIAGNOSIS — O285 Abnormal chromosomal and genetic finding on antenatal screening of mother: Secondary | ICD-10-CM

## 2021-12-31 DIAGNOSIS — O0990 Supervision of high risk pregnancy, unspecified, unspecified trimester: Secondary | ICD-10-CM | POA: Diagnosis not present

## 2021-12-31 DIAGNOSIS — Z6841 Body Mass Index (BMI) 40.0 and over, adult: Secondary | ICD-10-CM

## 2021-12-31 DIAGNOSIS — E119 Type 2 diabetes mellitus without complications: Secondary | ICD-10-CM

## 2021-12-31 MED ORDER — LABETALOL HCL 200 MG PO TABS: 200.0000 mg | ORAL_TABLET | Freq: Two times a day (BID) | ORAL | 2 refills | Status: DC

## 2021-12-31 MED ORDER — PROGESTERONE 200 MG PO CAPS: 200.0000 mg | ORAL_CAPSULE | Freq: Every day | ORAL | 2 refills | Status: DC

## 2021-12-31 MED ORDER — PROGESTERONE 200 MG VA SUPP: 200.0000 mg | Freq: Every day | VAGINAL | 2 refills | Status: DC

## 2021-12-31 NOTE — Progress Notes (Signed)
Patient has elevated GAD7. Declines BHC.   Alesia Richards, RN 12/31/21

## 2021-12-31 NOTE — Progress Notes (Signed)
Patient follow up ultrasound scheduled for 01/02/22 at 2 PM. Patient notified.

## 2021-12-31 NOTE — Addendum Note (Signed)
Addended by: Marjo Bicker on: 12/31/2021 03:13 PM   Modules accepted: Orders

## 2021-12-31 NOTE — Progress Notes (Signed)
PRENATAL VISIT NOTE  Subjective:  [name] is a 36 y.o. [phone] at [redacted]w[redacted]d being seen today for ongoing prenatal care.  She is currently monitored for the following issues for this high-risk pregnancy and has Severe recurrent major depression without psychotic features ([place]); Diabetes mellitus without complication ([place]); Supervision of high risk pregnancy, antepartum; Chronic heart failure with preserved ejection fraction (HFpEF) ([place]); Chronic hypertension affecting pregnancy; Hypercholesteremia; Anxiety; Depression; LGA (large for gestational age) fetus affecting management of mother; UTI in pregnancy; GBS bacteriuria; Abnormal genetic test during pregnancy; History of preterm delivery; Bipolar affective disorder, currently depressed, moderate ([place]); Anterior cervical lymphadenopathy; Amenorrhea; Allergic rhinitis; Chest pain; Chronic fatigue syndrome; Heart failure ([place]); Hypothyroidism; 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  She is a transfer of care from [place].  Patient reports no complaints.  Contractions: Not present. Vag. Bleeding: None.  Movement: Present. Denies leaking of fluid.   The following portions of the patient's history were reviewed and updated as appropriate: allergies, current medications, past family history, past medical history, past social history, past surgical history and problem list.   Objective:   Vitals:   12/31/21 1331  BP: (!) 146/77  Pulse: (!) 105  Weight: (!) 450 lb 1.6 oz (204.2 kg)    Fetal Status:     Movement: Present    Bedside US -- cardiac activity visualized  General:  Alert, oriented and cooperative.  Patient is in no acute distress.  Skin: Skin is warm and dry. No rash noted.   Cardiovascular: Normal heart rate noted  Respiratory: Normal respiratory effort, no problems with respiration noted  Abdomen: Soft, gravid, appropriate for gestational age.  Pain/Pressure: Present     Pelvic: Cervical exam deferred        Extremities: Normal range of motion.  Edema: Trace  Mental Status: Normal mood and affect. Normal behavior. Normal judgment and thought content.   Assessment and Plan:  Pregnancy: [id number] at [redacted]w[redacted]d 1. Chronic heart failure with preserved ejection fraction (HFpEF) ([place]) Has next appt with Dr. Harriet Masson on 2/10.  - [place] MFM OB FOLLOW UP; Future - US Fetal Echocardiography; Future  2. Chronic hypertension affecting pregnancy She is taking her Labetalol and Lasix. We will increase the dose of the labetalol to 200 BID. She is asymptomatic today. Will recheck CMP since drawing blood work today. She does have significant baseline proteinuria. Kidney function is normal otherwise.  - [place] MFM OB FOLLOW UP; Future  3. Sleep apnea, unspecified type Uses CPAP when sleeping/napping  4. Gastro-esophageal reflux disease without esophagitis Prilosec for this  5. Diabetes mellitus without complication ([place]) - She is currently managed with [facility] and is doing a pump. Last A1C was November. Will check today - [place] MFM OB FOLLOW UP; Future  6. Supervision of high risk pregnancy, antepartum - Declines flu shot - Will give Tdap today - Routine 28 wk labs - US Fetal Echocardiography; Future  7. GBS bacteriuria PCN in labor - [place] MFM OB FOLLOW UP; Future  8. Abnormal genetic test during pregnancy - She had abnormal NIPS due to low fetal fraction for both T13 and T18. She declines redraw and amnio and had genetic counseling with MFM - Continue serial growth - [place] MFM OB FOLLOW UP; Future - US Fetal Echocardiography; Future  9. History of preterm delivery - Had a 36w SVD and then two  term SVD. She is doing vag P as she declined 17OHP - [place] MFM OB FOLLOW UP; Future  10. Class 3 severe obesity with serious comorbidity and body mass index (BMI) of 60.0 to 69.9 in adult, unspecified obesity type ([place]) - [place] MFM OB FOLLOW UP; Future - US Fetal Echocardiography; Future  Preterm labor symptoms and general obstetric precautions including but not limited to vaginal bleeding, contractions, leaking of fluid and fetal movement were reviewed in detail with the patient. Please refer to After Visit Summary for other counseling recommendations.   Return in about 2 weeks (around 01/14/2022) for OB VISIT, MD only.  Future Appointments  Date Time Provider [place]  01/14/2022  1:30 PM [facility] PHARMACIST [facility]  01/16/2022  2:20 PM Tobb, Godfrey Pick, DO [facility] None    Radene Gunning, MD

## 2022-01-01 ENCOUNTER — Encounter: Payer: Self-pay | Admitting: *Deleted

## 2022-01-01 LAB — CBC
Hematocrit: 35.9 % (ref 34.0–46.6)
Hemoglobin: 12 g/dL (ref 11.1–15.9)
MCH: 27.1 pg (ref 26.6–33.0)
MCHC: 33.4 g/dL (ref 31.5–35.7)
MCV: 81 fL (ref 79–97)
Platelets: 327 10*3/uL (ref 150–450)
RBC: 4.42 x10E6/uL (ref 3.77–5.28)
RDW: 16.3 % — ABNORMAL HIGH (ref 11.7–15.4)
WBC: 12.1 10*3/uL — ABNORMAL HIGH (ref 3.4–10.8)

## 2022-01-01 LAB — COMPREHENSIVE METABOLIC PANEL
ALT: 10 IU/L (ref 0–32)
AST: 7 IU/L (ref 0–40)
Albumin/Globulin Ratio: 1.3 (ref 1.2–2.2)
Albumin: 4 g/dL (ref 3.8–4.8)
Alkaline Phosphatase: 49 IU/L (ref 44–121)
BUN/Creatinine Ratio: 21 (ref 9–23)
BUN: 12 mg/dL (ref 6–20)
Bilirubin Total: 0.7 mg/dL (ref 0.0–1.2)
CO2: 20 mmol/L (ref 20–29)
Calcium: 9.8 mg/dL (ref 8.7–10.2)
Chloride: 99 mmol/L (ref 96–106)
Creatinine, Ser: 0.56 mg/dL — ABNORMAL LOW (ref 0.57–1.00)
Globulin, Total: 3 g/dL (ref 1.5–4.5)
Glucose: 243 mg/dL — ABNORMAL HIGH (ref 70–99)
Potassium: 4.9 mmol/L (ref 3.5–5.2)
Sodium: 134 mmol/L (ref 134–144)
Total Protein: 7 g/dL (ref 6.0–8.5)
eGFR: 120 mL/min/{1.73_m2} (ref >59)

## 2022-01-01 LAB — HEMOGLOBIN A1C
Est. average glucose Bld gHb Est-mCnc: 160 mg/dL
Hgb A1c MFr Bld: 7.2 % — ABNORMAL HIGH (ref 4.8–5.6)

## 2022-01-01 LAB — HIV ANTIBODY (ROUTINE TESTING W REFLEX): HIV Screen 4th Generation wRfx: NONREACTIVE

## 2022-01-01 LAB — RPR: RPR Ser Ql: NONREACTIVE

## 2022-01-02 ENCOUNTER — Other Ambulatory Visit: Payer: Self-pay | Admitting: *Deleted

## 2022-01-02 ENCOUNTER — Ambulatory Visit: Payer: Medicaid Other | Admitting: *Deleted

## 2022-01-02 ENCOUNTER — Other Ambulatory Visit: Payer: Self-pay

## 2022-01-02 ENCOUNTER — Encounter: Payer: Self-pay | Admitting: Obstetrics and Gynecology

## 2022-01-02 ENCOUNTER — Ambulatory Visit: Payer: Medicaid Other | Attending: Obstetrics and Gynecology

## 2022-01-02 VITALS — BP 133/72 | HR 94

## 2022-01-02 DIAGNOSIS — E119 Type 2 diabetes mellitus without complications: Secondary | ICD-10-CM | POA: Insufficient documentation

## 2022-01-02 DIAGNOSIS — O10013 Pre-existing essential hypertension complicating pregnancy, third trimester: Secondary | ICD-10-CM | POA: Diagnosis not present

## 2022-01-02 DIAGNOSIS — R8271 Bacteriuria: Secondary | ICD-10-CM | POA: Diagnosis present

## 2022-01-02 DIAGNOSIS — Z6841 Body Mass Index (BMI) 40.0 and over, adult: Secondary | ICD-10-CM | POA: Insufficient documentation

## 2022-01-02 DIAGNOSIS — I5032 Chronic diastolic (congestive) heart failure: Secondary | ICD-10-CM | POA: Diagnosis present

## 2022-01-02 DIAGNOSIS — Z3A30 30 weeks gestation of pregnancy: Secondary | ICD-10-CM

## 2022-01-02 DIAGNOSIS — O24113 Pre-existing diabetes mellitus, type 2, in pregnancy, third trimester: Secondary | ICD-10-CM

## 2022-01-02 DIAGNOSIS — O09213 Supervision of pregnancy with history of pre-term labor, third trimester: Secondary | ICD-10-CM | POA: Diagnosis not present

## 2022-01-02 DIAGNOSIS — Z8751 Personal history of pre-term labor: Secondary | ICD-10-CM | POA: Diagnosis present

## 2022-01-02 DIAGNOSIS — O99213 Obesity complicating pregnancy, third trimester: Secondary | ICD-10-CM | POA: Insufficient documentation

## 2022-01-02 DIAGNOSIS — O09523 Supervision of elderly multigravida, third trimester: Secondary | ICD-10-CM

## 2022-01-02 DIAGNOSIS — O10919 Unspecified pre-existing hypertension complicating pregnancy, unspecified trimester: Secondary | ICD-10-CM | POA: Insufficient documentation

## 2022-01-02 DIAGNOSIS — O285 Abnormal chromosomal and genetic finding on antenatal screening of mother: Secondary | ICD-10-CM | POA: Insufficient documentation

## 2022-01-02 DIAGNOSIS — O10913 Unspecified pre-existing hypertension complicating pregnancy, third trimester: Secondary | ICD-10-CM

## 2022-01-02 DIAGNOSIS — Z7189 Other specified counseling: Secondary | ICD-10-CM | POA: Insufficient documentation

## 2022-01-02 NOTE — Progress Notes (Signed)
Pt has experienced blurred vision and headache on right side only for the past 3-4 weeks. He eye dr. Roney Jaffe its possible ocular migraines.

## 2022-01-05 ENCOUNTER — Telehealth: Payer: Self-pay | Admitting: Cardiology

## 2022-01-05 NOTE — Telephone Encounter (Signed)
Called pt to talk about her call. Spoke with Octavio Graves, LCSW about setting up rides for pt to get to her upcoming appointments. [place] about to set them up will call pt with the update.

## 2022-01-05 NOTE — Telephone Encounter (Signed)
Called pt to let her know she is set up with a ride to her 2/8 and 2/10 appt. She verbalized understanding,. Went over [employer] pt is to confirm her rides and also asked her to speak with staff at [employer] for women to get rides set up for her other upcoming appts.

## 2022-01-05 NOTE — Telephone Encounter (Signed)
Patient is calling requesting to speak with [facility] in regards to a question she has about her upcoming appointment with Dr. Servando Salina.

## 2022-01-14 ENCOUNTER — Ambulatory Visit (INDEPENDENT_AMBULATORY_CARE_PROVIDER_SITE_OTHER): Payer: Medicaid Other | Admitting: Pharmacist

## 2022-01-14 ENCOUNTER — Other Ambulatory Visit: Payer: Self-pay

## 2022-01-14 VITALS — BP 139/84 | HR 104 | Resp 19 | Ht 67.0 in | Wt >= 6400 oz

## 2022-01-14 DIAGNOSIS — O10919 Unspecified pre-existing hypertension complicating pregnancy, unspecified trimester: Secondary | ICD-10-CM

## 2022-01-14 DIAGNOSIS — E1165 Type 2 diabetes mellitus with hyperglycemia: Secondary | ICD-10-CM | POA: Insufficient documentation

## 2022-01-14 DIAGNOSIS — I5032 Chronic diastolic (congestive) heart failure: Secondary | ICD-10-CM

## 2022-01-14 DIAGNOSIS — O24113 Pre-existing diabetes mellitus, type 2, in pregnancy, third trimester: Secondary | ICD-10-CM | POA: Diagnosis not present

## 2022-01-14 HISTORY — DX: Type 2 diabetes mellitus with hyperglycemia: E11.65

## 2022-01-14 NOTE — Patient Instructions (Signed)
It was nice meeting you today  Continue your labetalol 200mg  twice a day and your furosemide 80mg  twice a day  Continue to watch your blood sugar.  Check your fasting blood sugar every morning and check it again one or two hours after a meal  Follow the healthy eating meal handout I gave you  Please call with any questions  , PharmD, BCACP, CDCES, CPP [address], [address] [place], [address], [place] Phone: [phone], Fax: [phone]

## 2022-01-14 NOTE — Progress Notes (Signed)
Patient ID: [name]                 DOB: 15-Dec-1985                      MRN: [id number]     HPI: Verlon Carcione is a 36 y.o. female referred by Dr. Harriet Masson to PharmD clinic.  Patient is currently [redacted] weeks pregnant. PMH is significant for HFpEF, HTN, uncontrolled T2DM, depression and bipolar disorder.  This is patient's first visit to PharmD clinic after frequent cancellations and no shows.  Patient has had limited visits with OB as well due to transportation issues.  This was an unplanned pregnancy. Also has 21 year old daughter. Reports OB does not want her doing any physical activity.  Was previously followed by endocrinology for her T2DM however she says she has not seen them in over a year.  Currently wears an Omnipod insulin pump. Settings on pump have never been changed.  Currently receiving 60U basal insulin pre 24 hours (2.5u/hr). Enters blood sugar after meals and device tells her how many units she should bolus. She does not know what her insulin:carb ratio or sensitivity factor are set at.  Had patient check BG in room: 251.  Reports she has been testing blood sugar twice daily and is always elevated.  Believes her last doctor set her fasting blood sugar goal as <150.  Reports she had a diabetes education course about 6 years ago.  Current HTN meds: labetalol 245m BID, furosemide 270mdaily  BP goal: <140/90, prefer target of 110-135/85  Wt Readings from Last 3 Encounters:  12/31/21 (!) 450 lb 1.6 oz (204.2 kg)  09/26/21 (!) 448 lb 12.8 oz (203.6 kg)  05/23/17 (!) 436 lb 3.2 oz (197.9 kg)   BP Readings from Last 3 Encounters:  01/02/22 133/72  12/31/21 (!) 146/77  12/15/21 131/64   Pulse Readings from Last 3 Encounters:  01/02/22 94  12/31/21 (!) 105  12/15/21 (!) 101    Renal function: Estimated Creatinine Clearance: 179.4 mL/min (A) (by C-G formula based on SCr of 0.56 mg/dL (L)).  Past Medical History:  Diagnosis Date   Anxiety    Bipolar  disorder (H[place]   CHF (congestive heart failure) ([place]   Depression    Diabetes mellitus without complication (HCC)    GERD (gastroesophageal reflux disease)    Heart failure (HCC)    HPV (human papilloma virus) infection    Hypercholesteremia    Hypertension    Ovarian cyst    Vaginal Pap smear, abnormal     Current Outpatient Medications on File Prior to Visit  Medication Sig Dispense Refill   aspirin EC 81 MG tablet Take 81 mg by mouth daily. Swallow whole.     Cholecalciferol (VITAMIN D3) 50 MCG (2000 UT) CHEW Chew by mouth.     furosemide (LASIX) 20 MG tablet Take 1 tablet (20 mg total) by mouth daily. (Patient taking differently: Take 80 mg by mouth 2 (two) times daily.) 90 tablet 3   insulin aspart (NOVOLOG) 100 UNIT/ML injection Inject into the skin 3 (three) times daily before meals.     Insulin Disposable Pump (OMNIPOD 5 G6 INTRO, GEN 5,) KIT by Does not apply route.     labetalol (NORMODYNE) 200 MG tablet Take 1 tablet (200 mg total) by mouth 2 (two) times daily. 60 tablet 2   magnesium 30 MG tablet Take 100 mg by mouth 2 (two) times  daily.     omeprazole (PRILOSEC) 40 MG capsule Take 40 mg by mouth daily.     Prenatal MV & Min w/FA-DHA (PRENATAL GUMMIES PO) Take by mouth.     progesterone 200 MG SUPP Place 1 suppository (200 mg total) vaginally at bedtime. 30 suppository 2   pyridoxine (B-6) 200 MG tablet Take 200 mg by mouth daily.     No current facility-administered medications on file prior to visit.    Allergies  Allergen Reactions   Shellfish Allergy Anaphylaxis     Assessment/Plan:  1. Hypertension -  Patient with very high risk pregnancy complicated by morbid obesity, maternal age, uncontrolled T2DM, and CHF. Unfortunately patient has not received routine diabetic education.  Unclear how much insulin she is receiving due to her boluses and fasting blood sugars are very elevated.  Luckily blood pressure is near goal. Will continue labetalol 281m  BID.  Explained the pathophysiology of T2DM, insulin resistance, and possible complications. Explained pathophysiology of having uncontrlled blood sugars while pregnant, including macrosomia, shoulder dystocia, and per-term birth. Patient did not know of possible comorbidities to fetus.  Explained glucose goals in pregnancy.  FBG <95, 1 hour post prandial <140, 2 hour post prandial <120.  Patient does not think her blood sugar levels have ever been that low. Advised it would be difficult without exercise which she is unfortunately not medically cleared to complete.  Advised her best chance at reducing blood sugar would be through strict diet changes.  Printed Planning Healthy meals handout and went over with patient in detail.  Discussed importance of vegetables, lean proteins, and healthy fats. If she was to eat carbohydrates recommended whole grains. Likely patients insulin pump settings need to be adjusted however do not have any current glucose readings.  Printed patient glucose log sheet for her to complete.  Patient has appointment with Dr THarriet Massonin 2 days. Recheck in 2 weeks.  CKarren Cobble PharmD, BCACP, [place] [address]Phone: [phone] Fax: [phone]

## 2022-01-15 ENCOUNTER — Ambulatory Visit (INDEPENDENT_AMBULATORY_CARE_PROVIDER_SITE_OTHER): Payer: Medicaid Other | Admitting: Obstetrics and Gynecology

## 2022-01-15 ENCOUNTER — Encounter: Payer: Self-pay | Admitting: Obstetrics and Gynecology

## 2022-01-15 VITALS — BP 114/64 | HR 99 | Wt >= 6400 oz

## 2022-01-15 DIAGNOSIS — O285 Abnormal chromosomal and genetic finding on antenatal screening of mother: Secondary | ICD-10-CM

## 2022-01-15 DIAGNOSIS — O0990 Supervision of high risk pregnancy, unspecified, unspecified trimester: Secondary | ICD-10-CM | POA: Diagnosis not present

## 2022-01-15 DIAGNOSIS — G4733 Obstructive sleep apnea (adult) (pediatric): Secondary | ICD-10-CM

## 2022-01-15 DIAGNOSIS — O10919 Unspecified pre-existing hypertension complicating pregnancy, unspecified trimester: Secondary | ICD-10-CM

## 2022-01-15 DIAGNOSIS — I5032 Chronic diastolic (congestive) heart failure: Secondary | ICD-10-CM | POA: Diagnosis not present

## 2022-01-15 DIAGNOSIS — Z8751 Personal history of pre-term labor: Secondary | ICD-10-CM

## 2022-01-15 DIAGNOSIS — O24113 Pre-existing diabetes mellitus, type 2, in pregnancy, third trimester: Secondary | ICD-10-CM | POA: Diagnosis not present

## 2022-01-15 DIAGNOSIS — R8271 Bacteriuria: Secondary | ICD-10-CM

## 2022-01-15 DIAGNOSIS — E1165 Type 2 diabetes mellitus with hyperglycemia: Secondary | ICD-10-CM

## 2022-01-15 NOTE — Progress Notes (Signed)
Subjective:  [name] is a 36 y.o. [phone] at [redacted]w[redacted]d being seen today for ongoing prenatal care.  She is currently monitored for the following issues for this high-risk pregnancy and has Severe recurrent major depression without psychotic features ([place]); Diabetes mellitus without complication ([place]); Supervision of high risk pregnancy, antepartum; Chronic heart failure with preserved ejection fraction (HFpEF) ([place]); Chronic hypertension affecting pregnancy; Hypercholesteremia; Anxiety; Depression; LGA (large for gestational age) fetus affecting management of mother; UTI in pregnancy; GBS bacteriuria; Abnormal genetic test during pregnancy; History of preterm delivery; Bipolar affective disorder, currently depressed, moderate ([place]); Anterior cervical lymphadenopathy; Amenorrhea; Allergic rhinitis; Chest pain; Chronic fatigue syndrome; Heart failure ([place]); Hypothyroidism; 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  Patient reports no complaints.  Contractions: Not present. Vag. Bleeding: None.  Movement: Present. Denies leaking of fluid.   The following portions of the patient's history were reviewed and updated as appropriate: allergies, current medications, past family history, past medical history, past social history, past surgical history and problem list. Problem list updated.  Objective:   Vitals:   01/15/22 1633  BP: 114/64  Pulse: 99  Weight: (!) 456 lb 14.4 oz (207.2 kg)    Fetal Status:     Movement: Present     General:  Alert, oriented and  cooperative. Patient is in no acute distress.  Skin: Skin is warm and dry. No rash noted.   Cardiovascular: Normal heart rate noted  Respiratory: Normal respiratory effort, no problems with respiration noted  Abdomen: Soft, gravid, appropriate for gestational age. Pain/Pressure: Present     Pelvic:  Cervical exam deferred        Extremities: Normal range of motion.  Edema: Trace  Mental Status: Normal mood and affect. Normal behavior. Normal judgment and thought content.   Urinalysis:      Assessment and Plan:  Pregnancy: [id number] at [redacted]w[redacted]d  1. Supervision of high risk pregnancy, antepartum Stable  2. Chronic heart failure with preserved ejection fraction (HFpEF) ([place]) See's OB cards tomorrow  3. Chronic hypertension affecting pregnancy Stable On current regiment See above as well Serial growth scans and antenatal testing as per MFM  4. Pre-existing type 2 diabetes mellitus with hyperglycemia during pregnancy in third trimester (HCC) Insulin pump Followed by Endocrine  5. Obstructive sleep apnea syndrome On CPAP  6. GBS bacteriuria Tx while in labor  7. Abnormal genetic test during pregnancy Declined additional testing  8. History of preterm delivery Stable Continue with vaginal prometrium  Preterm labor symptoms and general obstetric precautions including but not limited to vaginal bleeding, contractions, leaking of fluid and fetal movement were reviewed in detail with the patient. Please refer to After Visit Summary for other counseling recommendations.  Return in about 2 weeks (around 01/29/2022) for OB visit, face to face, MD only.   Chancy Milroy, MD

## 2022-01-15 NOTE — Patient Instructions (Signed)

## 2022-01-16 ENCOUNTER — Telehealth: Payer: Self-pay | Admitting: Licensed Clinical Social Worker

## 2022-01-16 ENCOUNTER — Ambulatory Visit: Payer: Medicaid Other | Attending: Obstetrics and Gynecology

## 2022-01-16 ENCOUNTER — Ambulatory Visit: Payer: Medicaid Other | Admitting: Cardiology

## 2022-01-16 ENCOUNTER — Ambulatory Visit: Payer: Medicaid Other

## 2022-01-16 NOTE — Progress Notes (Deleted)
°  Heart and Vascular Care Navigation  01/16/2022  Katrece Roediger [facility] 30-Jan-1986 [id number]  Reason for Referral:    {HRT/VASTELEPHONEFACETOFACE:25379} for {HRT/VASINITIALFOLLOWUPCHOICE:25380} for Heart and Vascular Care Coordination.                                                                                                   Assessment:                                      HRT/VAS Care Coordination     Patients Home Cardiology Office [facility]   Outpatient Care Team Social Worker   Social Worker Name: Octavio Graves, [place], [phone]   Living arrangements for the past 2 months Single Family Home   Lives with: Parents   Patient Current Insurance Coverage Medicaid   Patient Has Concern With Paying Medical Bills No   Does Patient Have Prescription Coverage? Yes   Home Assistive Devices/Equipment None       Social History:                                                                             SDOH Screenings   Alcohol Screen: Not on file  Depression (PHQ2-9): Medium Risk   PHQ-2 Score: 18  Financial Resource Strain: Not on file  Food Insecurity: No Food Insecurity   Worried About Programme researcher, broadcasting/film/video in the Last Year: Never true   Ran Out of Food in the Last Year: Never true  Housing: Not on file  Physical Activity: Not on file  Social Connections: Not on file  Stress: Not on file  Tobacco Use: Low Risk    Smoking Tobacco Use: Never   Smokeless Tobacco Use: Never   Passive Exposure: Not on file  Transportation Needs: Unmet Transportation Needs   Lack of Transportation (Medical): Yes   Lack of Transportation (Non-Medical): No    SDOH Interventions: Financial Resources:    {CHL AMB HRT/VAS CCM FINANCIAL RESOURCES INTERVENTIONS:23728}  Food Insecurity:     Housing Insecurity:     Transportation:       Health Promotion Interventions:     Physical Inactivity {CHL AMB HRT/VAS OTHER REFERRALS:23729}  Smoking Cessation {CHL AMB HRT/VAS SMOKING  CESSATON:23731}  Dietary Concerns ***  Health Coaching {CHL AMB HRT/VAS HEALTH COACHING:23732}   Other Care Navigation Interventions:     Inpatient/Outpatient Substance Abuse Counseling/Rehab Options ***  Provided Pharmacy assistance resources    Patient expressed Mental Health concerns {CHL AMB HRT/VAS MENTAL HEALTH CONCERN YES/NO:23730}  Patient Referred to: ***   Follow-up plan:

## 2022-01-16 NOTE — Telephone Encounter (Signed)
Attempted to call pt to discuss transportation challenges. Pt phone went straight to voicemail at [phone]. Left voicemail requesting call back. In the mean time I will mail additional transportation resources to pt home address.   Westley Hummer, MSW, [place]  [phone]- work cell phone (preferred) [phone]- desk phone

## 2022-01-16 NOTE — Progress Notes (Signed)
Patient established with [facility] services outside of office   [place], [place]

## 2022-01-19 ENCOUNTER — Telehealth: Payer: Self-pay | Admitting: Licensed Clinical Social Worker

## 2022-01-19 ENCOUNTER — Telehealth: Payer: Self-pay

## 2022-01-19 NOTE — Telephone Encounter (Signed)
Patient called to set up transportation - temporary due to only (1) vehicle at this time. I did explain that future transportation will need to be set up through [facility] transportation
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
## 2022-01-21 ENCOUNTER — Ambulatory Visit: Payer: Medicaid Other | Admitting: *Deleted

## 2022-01-21 ENCOUNTER — Ambulatory Visit: Payer: Medicaid Other | Attending: Obstetrics and Gynecology

## 2022-01-21 ENCOUNTER — Other Ambulatory Visit: Payer: Self-pay

## 2022-01-21 VITALS — BP 126/68 | HR 92

## 2022-01-21 DIAGNOSIS — O24113 Pre-existing diabetes mellitus, type 2, in pregnancy, third trimester: Secondary | ICD-10-CM | POA: Insufficient documentation

## 2022-01-21 DIAGNOSIS — O09523 Supervision of elderly multigravida, third trimester: Secondary | ICD-10-CM | POA: Insufficient documentation

## 2022-01-21 DIAGNOSIS — Z3A33 33 weeks gestation of pregnancy: Secondary | ICD-10-CM

## 2022-01-21 DIAGNOSIS — O99213 Obesity complicating pregnancy, third trimester: Secondary | ICD-10-CM

## 2022-01-21 DIAGNOSIS — O10913 Unspecified pre-existing hypertension complicating pregnancy, third trimester: Secondary | ICD-10-CM | POA: Insufficient documentation

## 2022-01-21 DIAGNOSIS — O10013 Pre-existing essential hypertension complicating pregnancy, third trimester: Secondary | ICD-10-CM | POA: Diagnosis not present

## 2022-01-21 DIAGNOSIS — Z6841 Body Mass Index (BMI) 40.0 and over, adult: Secondary | ICD-10-CM | POA: Insufficient documentation

## 2022-01-23 ENCOUNTER — Telehealth: Payer: Self-pay | Admitting: Licensed Clinical Social Worker

## 2022-01-23 ENCOUNTER — Ambulatory Visit: Payer: Medicaid Other

## 2022-01-23 NOTE — Telephone Encounter (Signed)
Noted appt was cancelled, per chart was cancelled by pt, I was not aware until this morning.   I reached out to Transportation Services to see if a ride had been dispatched which unfortunately it had. Apologized for lack of communication regarding cancellation. Will f/u with pt to arrange a ride closer to appt on 24th.   Westley Hummer, MSW, [place]  [phone]- work cell phone (preferred) [phone]- desk phone

## 2022-01-27 ENCOUNTER — Telehealth: Payer: Self-pay | Admitting: Licensed Clinical Social Worker

## 2022-01-27 NOTE — Telephone Encounter (Signed)
Coordinated rides for pt through [employer], coordinator, not through [employer]) to get pt to appts on 2/23 and 2/24. Pt lives in area that it is often difficult to connect w/ uber drivers. I have made notes on her appts. We can coordinate rides home through [place] when appts completed.   Octavio Graves, MSW, LCSW Clinical Social Worker II [facility] Navigation  [phone]- work cell phone (preferred) [phone]- desk phone

## 2022-01-28 ENCOUNTER — Telehealth: Payer: Self-pay | Admitting: Licensed Clinical Social Worker

## 2022-01-28 NOTE — Telephone Encounter (Signed)
LCSW confirmed with Ephriam Knuckles, coordinator w/ [employer] that rides scheduled for pt via dispatch due to distance and challenges w/ [place] drivers. I have noted that pt had ride scheduled on [employer], I have cancelled that, made pt aware of dispatch as well as staff at the clinic at [facility]. Pt appreciative, was encouraged to contact us if any hiccups. I remain available.   Octavio Graves, MSW, LCSW Clinical Social Worker II [facility] Navigation  [phone]- work cell phone (preferred) [phone]- desk phone

## 2022-01-29 ENCOUNTER — Ambulatory Visit (INDEPENDENT_AMBULATORY_CARE_PROVIDER_SITE_OTHER): Payer: Medicaid Other | Admitting: Obstetrics and Gynecology

## 2022-01-29 ENCOUNTER — Other Ambulatory Visit: Payer: Self-pay

## 2022-01-29 ENCOUNTER — Encounter: Payer: Self-pay | Admitting: Obstetrics and Gynecology

## 2022-01-29 VITALS — BP 133/82 | HR 103 | Wt >= 6400 oz

## 2022-01-29 DIAGNOSIS — O24113 Pre-existing diabetes mellitus, type 2, in pregnancy, third trimester: Secondary | ICD-10-CM

## 2022-01-29 DIAGNOSIS — O0990 Supervision of high risk pregnancy, unspecified, unspecified trimester: Secondary | ICD-10-CM | POA: Diagnosis not present

## 2022-01-29 DIAGNOSIS — O10919 Unspecified pre-existing hypertension complicating pregnancy, unspecified trimester: Secondary | ICD-10-CM | POA: Diagnosis not present

## 2022-01-29 DIAGNOSIS — I5032 Chronic diastolic (congestive) heart failure: Secondary | ICD-10-CM | POA: Diagnosis not present

## 2022-01-29 DIAGNOSIS — E1165 Type 2 diabetes mellitus with hyperglycemia: Secondary | ICD-10-CM

## 2022-01-29 DIAGNOSIS — Z8751 Personal history of pre-term labor: Secondary | ICD-10-CM

## 2022-01-29 DIAGNOSIS — R8271 Bacteriuria: Secondary | ICD-10-CM | POA: Diagnosis not present

## 2022-01-29 NOTE — Patient Instructions (Signed)

## 2022-01-29 NOTE — Progress Notes (Signed)
Subjective:  [name] is a 36 y.o. [phone] at [redacted]w[redacted]d being seen today for ongoing prenatal care.  She is currently monitored for the following issues for this high-risk pregnancy and has Severe recurrent major depression without psychotic features ([place]); Diabetes mellitus without complication ([place]); Supervision of high risk pregnancy, antepartum; Chronic heart failure with preserved ejection fraction (HFpEF) ([place]); Chronic hypertension affecting pregnancy; Hypercholesteremia; Anxiety; Depression; LGA (large for gestational age) fetus affecting management of mother; UTI in pregnancy; GBS bacteriuria; Abnormal genetic test during pregnancy; History of preterm delivery; Bipolar affective disorder, currently depressed, moderate ([place]); Anterior cervical lymphadenopathy; Amenorrhea; Allergic rhinitis; Chest pain; Chronic fatigue syndrome; Heart failure ([place]); Hypothyroidism; 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  Patient reports general discomforts of pregnancy.  Contractions: Not present. Vag. Bleeding: None.  Movement: Present. Denies leaking of fluid.   The following portions of the patient's history were reviewed and updated as appropriate: allergies, current medications, past family history, past medical history, past social history, past surgical history and problem list. Problem list updated.  Objective:   Vitals:   01/29/22 1329  BP: 133/82  Pulse: (!) 103  Weight: (!) 466 lb 14.4 oz (211.8 kg)    Fetal Status:     Movement: Present      General:  Alert, oriented and cooperative. Patient is in no acute distress.  Skin: Skin is warm and dry. No rash noted.   Cardiovascular: Normal heart rate noted  Respiratory: Normal respiratory effort, no problems with respiration noted  Abdomen: Soft, gravid, appropriate for gestational age. Pain/Pressure: Present     Pelvic:  Cervical exam deferred        Extremities: Normal range of motion.  Edema: Trace  Mental Status: Normal mood and affect. Normal behavior. Normal judgment and thought content.   Urinalysis:      Assessment and Plan:  Pregnancy: [id number] at [redacted]w[redacted]d  1. Supervision of high risk pregnancy, antepartum Stable GBS and vaginal cultures  2. Chronic hypertension affecting pregnancy Stable Serial growth scans and antenatal testing as per protocol  3. Chronic heart failure with preserved ejection fraction (HFpEF) ([place]) Sees OB cards this FRiday  4. GBS bacteriuria Tx while in labor  5. Pre-existing type 2 diabetes mellitus with hyperglycemia during pregnancy in third trimester ([place]) Followed by Endocrine  6. History of preterm delivery Continue with vaginal progesterone  Preterm labor symptoms and general obstetric precautions including but not limited to vaginal bleeding, contractions, leaking of fluid and fetal movement were reviewed in detail with the patient. Please refer to After Visit Summary for other counseling recommendations.  Return in about 2 weeks (around 02/12/2022) for OB visit, face to face, MD only.   Chancy Milroy, MD

## 2022-01-30 ENCOUNTER — Ambulatory Visit: Payer: Medicaid Other | Attending: Obstetrics and Gynecology

## 2022-01-30 ENCOUNTER — Ambulatory Visit: Payer: Medicaid Other | Admitting: *Deleted

## 2022-01-30 ENCOUNTER — Ambulatory Visit (INDEPENDENT_AMBULATORY_CARE_PROVIDER_SITE_OTHER): Payer: Medicaid Other | Admitting: Cardiology

## 2022-01-30 ENCOUNTER — Encounter: Payer: Self-pay | Admitting: Cardiology

## 2022-01-30 VITALS — BP 131/71 | HR 100

## 2022-01-30 VITALS — BP 168/80 | HR 106 | Ht 67.0 in | Wt >= 6400 oz

## 2022-01-30 DIAGNOSIS — O10919 Unspecified pre-existing hypertension complicating pregnancy, unspecified trimester: Secondary | ICD-10-CM | POA: Diagnosis not present

## 2022-01-30 DIAGNOSIS — Z6841 Body Mass Index (BMI) 40.0 and over, adult: Secondary | ICD-10-CM

## 2022-01-30 DIAGNOSIS — O10913 Unspecified pre-existing hypertension complicating pregnancy, third trimester: Secondary | ICD-10-CM | POA: Diagnosis present

## 2022-01-30 DIAGNOSIS — O99213 Obesity complicating pregnancy, third trimester: Secondary | ICD-10-CM

## 2022-01-30 DIAGNOSIS — O0990 Supervision of high risk pregnancy, unspecified, unspecified trimester: Secondary | ICD-10-CM

## 2022-01-30 DIAGNOSIS — O24113 Pre-existing diabetes mellitus, type 2, in pregnancy, third trimester: Secondary | ICD-10-CM

## 2022-01-30 DIAGNOSIS — I5032 Chronic diastolic (congestive) heart failure: Secondary | ICD-10-CM

## 2022-01-30 DIAGNOSIS — E119 Type 2 diabetes mellitus without complications: Secondary | ICD-10-CM | POA: Diagnosis not present

## 2022-01-30 DIAGNOSIS — O99210 Obesity complicating pregnancy, unspecified trimester: Secondary | ICD-10-CM

## 2022-01-30 DIAGNOSIS — Z3A34 34 weeks gestation of pregnancy: Secondary | ICD-10-CM | POA: Diagnosis not present

## 2022-01-30 DIAGNOSIS — O10013 Pre-existing essential hypertension complicating pregnancy, third trimester: Secondary | ICD-10-CM

## 2022-01-30 DIAGNOSIS — O09523 Supervision of elderly multigravida, third trimester: Secondary | ICD-10-CM

## 2022-01-30 DIAGNOSIS — E1165 Type 2 diabetes mellitus with hyperglycemia: Secondary | ICD-10-CM

## 2022-01-30 NOTE — Patient Instructions (Signed)
Medication Instructions:  Your physician recommends that you continue on your current medications as directed. Please refer to the Current Medication list given to you today.  *If you need a refill on your cardiac medications before your next appointment, please call your pharmacy*   Lab Work: None If you have labs (blood work) drawn today and your tests are completely normal, you will receive your results only by: MyChart Message (if you have MyChart) OR A paper copy in the mail If you have any lab test that is abnormal or we need to change your treatment, we will call you to review the results.   Testing/Procedures: None   Follow-Up: At [facility], you and your health needs are our priority.  As part of our continuing mission to provide you with exceptional heart care, we have created designated Provider Care Teams.  These Care Teams include your primary Cardiologist (physician) and Advanced Practice Providers (APPs -  Physician Assistants and Nurse Practitioners) who all work together to provide you with the care you need, when you need it.  We recommend signing up for the patient portal called "MyChart".  Sign up information is provided on this After Visit Summary.  MyChart is used to connect with patients for Virtual Visits (Telemedicine).  Patients are able to view lab/test results, encounter notes, upcoming appointments, etc.  Non-urgent messages can be sent to your provider as well.   To learn more about what you can do with MyChart, go to [email or web address].    Your next appointment:    2 week postpartum (3/20)   The format for your next appointment:   In Person  Provider:   Thomasene Ripple, DO [address], [place], [place] [address]

## 2022-01-30 NOTE — Progress Notes (Signed)
Cardio-Obstetrics Clinic  Follow Up Note   Date:  02/02/2022   ID:  [name], DOB 03/19/1986, MRN [id number]  PCP:  Bonnita Nasuti, MD   [facility] Providers Cardiologist:  Berniece Salines, DO  Electrophysiologist:  None        Referring MD: Bonnita Nasuti, MD   Chief Complaint: " I am feeling a lot better"  History of Present Illness:    [name] is a 36 y.o. female [[id number]] who returns for follow up of chronic diastolic heart failure.  She is currently [redacted] weeks pregnant.  Medical history includes diabetes mellitus, hypertension, hyperlipidemia, diastolic heart failure and morbid obesity.  I last saw the patient on September 26, 2021 at that time we reinitiated her Lasix which she had been helped.  Prior to her follow-up she was admitted at [facility] where she was treated for exacerbation of heart failure.  Since that time she has had some social challenges with transportation.  She was unable to make a few of her appointments.  But we have been able to set the patient with transportation.  She was finally able to see my pharmacy colleagues for caring for her diabetes as well as following up for her hypertension pregnancy.  She also has been able to follow-up with OB/GYN as well.  Today she notes that she has some shortness of breath but this has improved.  She continues to take her Lasix.  She is working on her diabetes as well.  She is in good spirits today her husband also is in the room.  Prior CV Studies Reviewed: The following studies were reviewed today:  TTE 10/22/2022 PROCEDURE  Image Quality: Technically difficult. No contrast used due to  pregnancy.  -  SUMMARY  The left ventricular size is normal.  Left ventricular systolic function is low normal.  LV ejection fraction = 50-55%.  Unable to fully assess LV regional wall motion  The right ventricle is normal in size and function.  The left atrium is mildly  dilated.  There is no significant valvular stenosis or regurgitation.  There was insufficient TR detected to calculate RV systolic pressure.  IVC size was mildly dilated.  There is trivial to small size pericardial effusion.  There is no comparison study available.   -  FINDINGS:  LEFT VENTRICLE  The left ventricular size is normal. There is normal left ventricular  wall thickness. Left ventricular systolic function is low normal. LV  ejection fraction = 50-55%. Left ventricular filling pattern is  indeterminate. Unable to fully assess LV regional wall motion.   -  RIGHT VENTRICLE  The right ventricle is normal in size and function.   LEFT ATRIUM  The left atrium is mildly dilated.   RIGHT ATRIUM  Right atrial size is normal.  -  AORTIC VALVE  Structurally normal aortic valve. There is no aortic regurgitation.  -  MITRAL VALVE  The mitral valve leaflets appear normal. There is trace mitral  regurgitation.  -  TRICUSPID VALVE  The tricuspid valve is not well visualized. There is trace tricuspid  regurgitation. There was insufficient TR detected to calculate RV  systolic pressure. Estimated right atrial pressure is 10 mmHg..  -  PULMONIC VALVE  The pulmonic valve is not well visualized. There is no pulmonic  valvular regurgitation.  -  ARTERIES  The aortic sinus is normal size.  -  VENOUS  Pulmonary venous flow pattern not well visualized. IVC size was  mildly  dilated.  -  EFFUSION  There is small size pericardial effusion.  -  -     Past Medical History:  Diagnosis Date   Anxiety    Bipolar disorder ([place])    CHF (congestive heart failure) ([place])    Depression    Diabetes mellitus without complication (HCC)    GERD (gastroesophageal reflux disease)    Heart failure (HCC)    HPV (human papilloma virus) infection    Hypercholesteremia    Hypertension    Ovarian cyst    Vaginal Pap smear, abnormal     Past Surgical History:  Procedure Laterality Date    CHOLECYSTECTOMY     SALPINGECTOMY Left    Benign growth      OB History     Gravida  5   Para  3   Term  2   Preterm  1   AB  1   Living  3      SAB  1   IAB      Ectopic      Multiple      Live Births  2               Current Medications: Current Meds  Medication Sig   aspirin EC 81 MG tablet Take 81 mg by mouth daily. Swallow whole.   Cholecalciferol (VITAMIN D3) 50 MCG (2000 UT) CHEW Chew by mouth.   furosemide (LASIX) 20 MG tablet Take 1 tablet (20 mg total) by mouth daily. (Patient taking differently: Take 80 mg by mouth 2 (two) times daily.)   insulin aspart (NOVOLOG) 100 UNIT/ML injection Inject into the skin 3 (three) times daily before meals.   Insulin Disposable Pump (OMNIPOD 5 G6 INTRO, GEN 5,) KIT by Does not apply route.   labetalol (NORMODYNE) 200 MG tablet Take 1 tablet (200 mg total) by mouth 2 (two) times daily.   magnesium 30 MG tablet Take 100 mg by mouth 2 (two) times daily.   omeprazole (PRILOSEC) 40 MG capsule Take 40 mg by mouth daily.   Prenatal MV & Min w/FA-DHA (PRENATAL GUMMIES PO) Take by mouth.   progesterone 200 MG SUPP Place 1 suppository (200 mg total) vaginally at bedtime.   pyridoxine (B-6) 200 MG tablet Take 200 mg by mouth daily.     Allergies:   Shellfish allergy   Social History   Socioeconomic History   Marital status: Single    Spouse name: Not on file   Number of children: Not on file   Years of education: Not on file   Highest education level: Not on file  Occupational History   Not on file  Tobacco Use   Smoking status: Never   Smokeless tobacco: Never  Vaping Use   Vaping Use: Never used  Substance and Sexual Activity   Alcohol use: No   Drug use: No   Sexual activity: Never  Other Topics Concern   Not on file  Social History Narrative   Not on file   Social Determinants of Health   Financial Resource Strain: Not on file  Food Insecurity: No Food Insecurity   Worried About Running Out of  Food in the Last Year: Never true   Ran Out of Food in the Last Year: Never true  Transportation Needs: Unmet Transportation Needs   Lack of Transportation (Medical): Yes   Lack of Transportation (Non-Medical): No  Physical Activity: Not on file  Stress: Not on file  Social Connections: Not on  file      Family History  Problem Relation Age of Onset   Heart disease Mother    COPD Mother    Diabetes Mother    Depression Mother    Heart disease Son    Asthma Son       ROS:   Please see the history of present illness.     All other systems reviewed and are negative.   Labs/EKG Reviewed:    EKG:   EKG is was not ordered today.  Recent Labs: 12/31/2021: ALT 10; BUN 12; Creatinine, Ser 0.56; Hemoglobin 12.0; Platelets 327; Potassium 4.9; Sodium 134   Recent Lipid Panel Lab Results  Component Value Date/Time   CHOL 156 05/24/2017 06:10 AM   TRIG 408 (H) 05/24/2017 06:10 AM   HDL 31 (L) 05/24/2017 06:10 AM   CHOLHDL 5.0 05/24/2017 06:10 AM   LDLCALC UNABLE TO CALCULATE IF TRIGLYCERIDE OVER 400 mg/dL 05/24/2017 06:10 AM    Physical Exam:    VS:  BP (!) 168/80    Pulse (!) 106    Ht _0  (1.702 m)    Wt (!) 471 lb (213.6 kg)    LMP 06/04/2021    SpO2 98%    BMI 73.77 kg/m     Wt Readings from Last 3 Encounters:  01/30/22 (!) 471 lb (213.6 kg)  01/29/22 (!) 466 lb 14.4 oz (211.8 kg)  01/15/22 (!) 456 lb 14.4 oz (207.2 kg)     GEN:  Well nourished, well developed in no acute distress HEENT: Normal NECK: No JVD; No carotid bruits LYMPHATICS: No lymphadenopathy CARDIAC: RRR, no murmurs, rubs, gallops RESPIRATORY:  Clear to auscultation without rales, wheezing or rhonchi  ABDOMEN: Soft, non-tender, non-distended MUSCULOSKELETAL:  No edema; No deformity  SKIN: Warm and dry NEUROLOGIC:  Alert and oriented x 3 PSYCHIATRIC:  Normal affect    Risk Assessment/Risk Calculators:     CARPREG II Risk Prediction Index Score:  1.  The patient's risk for a primary cardiac  event is 5%.            ASSESSMENT & PLAN:    Chronic hypertension in pregnancy Obesity in pregnancy Chronic diastolic heart failure High risk pregnancy  Clinically she appears to be doing well.  But her blood pressure is elevated and hydralazine 25 mg twice daily to her regimen.  She will remain on the labetalol as well as the Lasix.  She has been managed also with our pharmacy team with her diabetes education.  She seems to be doing well with following the recommendations.  Clinically she is at her baseline fluid state.  No significant volume overload.  Keep the Lasix doses the same.    Patient Instructions  Medication Instructions:  Your physician recommends that you continue on your current medications as directed. Please refer to the Current Medication list given to you today.  *If you need a refill on your cardiac medications before your next appointment, please call your pharmacy*   Lab Work: None If you have labs (blood work) drawn today and your tests are completely normal, you will receive your results only by: [place] (if you have MyChart) OR A paper copy in the mail If you have any lab test that is abnormal or we need to change your treatment, we will call you to review the results.   Testing/Procedures: None   Follow-Up: At [facility], you and your health needs are our priority.  As part of our continuing mission to provide  you with exceptional heart care, we have created designated Provider Care Teams.  These Care Teams include your primary Cardiologist (physician) and Advanced Practice Providers (APPs -  Physician Assistants and Nurse Practitioners) who all work together to provide you with the care you need, when you need it.  We recommend signing up for the patient portal called "MyChart".  Sign up information is provided on this After Visit Summary.  MyChart is used to connect with patients for Virtual Visits (Telemedicine).  Patients are able  to view lab/test results, encounter notes, upcoming appointments, etc.  Non-urgent messages can be sent to your provider as well.   To learn more about what you can do with MyChart, go to [email or web address].    Your next appointment:    2 week postpartum (3/20)   The format for your next appointment:   In Person  Provider:   Berniece Salines, DO [address], [place], [place] [address]      Dispo:  No follow-ups on file.   Medication Adjustments/Labs and Tests Ordered: Current medicines are reviewed at length with the patient today.  Concerns regarding medicines are outlined above.  Tests Ordered: No orders of the defined types were placed in this encounter.  Medication Changes: No orders of the defined types were placed in this encounter.

## 2022-02-02 ENCOUNTER — Telehealth: Payer: Self-pay | Admitting: Licensed Clinical Social Worker

## 2022-02-02 ENCOUNTER — Other Ambulatory Visit: Payer: Self-pay

## 2022-02-02 ENCOUNTER — Other Ambulatory Visit: Payer: Self-pay | Admitting: *Deleted

## 2022-02-02 DIAGNOSIS — Z6841 Body Mass Index (BMI) 40.0 and over, adult: Secondary | ICD-10-CM

## 2022-02-02 DIAGNOSIS — O24113 Pre-existing diabetes mellitus, type 2, in pregnancy, third trimester: Secondary | ICD-10-CM

## 2022-02-02 MED ORDER — HYDRALAZINE HCL 25 MG PO TABS: 25.0000 mg | ORAL_TABLET | Freq: Two times a day (BID) | ORAL | 3 refills | Status: DC

## 2022-02-02 NOTE — Telephone Encounter (Signed)
LCSW left a voicemail with pt regarding transportation, offered to connect w/ [facility] as pt with continued visits w/ providers these next few weeks.   Octavio Graves, MSW, LCSW Clinical Social Worker II [facility] Navigation  [phone]- work cell phone (preferred) [phone]- desk phone

## 2022-02-02 NOTE — Telephone Encounter (Signed)
Pt returned my call but did not leave voicemail.  I called her back and she answered this time. She states [facility] told her that they are not going out of [place] at this time due to lack of drivers. LCSW shared that she does not have another appt with [place] for a few weeks but if that appt comes up and she still remains without an appt to let us know. Unable to schedule rides for the OB/GYN visits upcoming, encouraged her to contact their staff regarding ongoing ride challenges. Pt hopes to get their new car in the coming weeks.   Pt also shares that she missed a call earlier from this office. Noted it was from [place] N, RN, and was able to coordinate w/Jasmine for pt to get updated recs from last visit. Remain available as needed.   Octavio Graves, MSW, LCSW Clinical Social Worker II [facility] Navigation  [phone]- work cell phone (preferred) [phone]- desk phone

## 2022-02-02 NOTE — Progress Notes (Signed)
Spoke to pt about the new medication. She was encouraged to take her blood pressure daily and send a MyChart message with heart rates in 2 weeks. She verbalized understanding. No further questions at this time.

## 2022-02-02 NOTE — Progress Notes (Signed)
Called pt to let her know Dr. Harriet Masson would like to start on Hydralazine 25 mg twice daily since her blood pressure was elevated Friday. No answer at this time. Left message for her to return the call.

## 2022-02-04 ENCOUNTER — Telehealth: Payer: Self-pay | Admitting: Cardiology

## 2022-02-04 NOTE — Telephone Encounter (Signed)
Patient is returning call.  °

## 2022-02-04 NOTE — Telephone Encounter (Signed)
Left message to call back  

## 2022-02-04 NOTE — Telephone Encounter (Signed)
Pt c/o Shortness Of Breath: STAT if SOB developed within the last 24 hours or pt is noticeably SOB on the phone ? ?1. Are you currently SOB (can you hear that pt is SOB on the phone)? no ? ?2. How long have you been experiencing SOB? Last two day ? ?3. Are you SOB when sitting or when up moving around? both ? ?4. Are you currently experiencing any other symptoms? Also tight pressure in her chest she states its not chest pain.Patient have concerns bout fluid she is retaining. Please advise ? ?

## 2022-02-05 ENCOUNTER — Ambulatory Visit: Payer: Medicaid Other | Attending: Obstetrics and Gynecology

## 2022-02-05 ENCOUNTER — Ambulatory Visit: Payer: Medicaid Other

## 2022-02-05 NOTE — Telephone Encounter (Signed)
Patient reports tightness in chest and sleeps on her side. She is taking medications as prescribed. She has not noticed any edema in feet/legs "that is not unusual for my pregnancy." BP at 12:30 pm today was 137/78, P 104. When I had her check it, she had just helped with carpet removal 175/102, 96. I had her rest for a couple of minutes, then BP 141/79. Dr. [place] DOD advised for patient not to do anything strenuous and to rest as much as possible. Pt voiced understanding. ?

## 2022-02-06 ENCOUNTER — Ambulatory Visit: Payer: Medicaid Other | Admitting: Internal Medicine

## 2022-02-06 ENCOUNTER — Inpatient Hospital Stay (HOSPITAL_COMMUNITY)
Admission: AD | Admit: 2022-02-06 | Discharge: 2022-02-09 | DRG: 806 | Disposition: A | Discharge status: Home / Self Care | Payer: Medicaid Other | Attending: Obstetrics and Gynecology | Admitting: Obstetrics and Gynecology

## 2022-02-06 ENCOUNTER — Encounter (HOSPITAL_COMMUNITY): Payer: Self-pay | Admitting: Obstetrics & Gynecology

## 2022-02-06 ENCOUNTER — Other Ambulatory Visit: Payer: Self-pay

## 2022-02-06 ENCOUNTER — Telehealth: Payer: Self-pay

## 2022-02-06 ENCOUNTER — Inpatient Hospital Stay (HOSPITAL_COMMUNITY): Payer: Medicaid Other | Admitting: Anesthesiology

## 2022-02-06 ENCOUNTER — Inpatient Hospital Stay (HOSPITAL_COMMUNITY): Payer: Medicaid Other

## 2022-02-06 DIAGNOSIS — O1410 Severe pre-eclampsia, unspecified trimester: Secondary | ICD-10-CM | POA: Diagnosis present

## 2022-02-06 DIAGNOSIS — R8271 Bacteriuria: Secondary | ICD-10-CM | POA: Diagnosis present

## 2022-02-06 DIAGNOSIS — O99824 Streptococcus B carrier state complicating childbirth: Secondary | ICD-10-CM | POA: Diagnosis present

## 2022-02-06 DIAGNOSIS — Z30430 Encounter for insertion of intrauterine contraceptive device: Secondary | ICD-10-CM

## 2022-02-06 DIAGNOSIS — G4733 Obstructive sleep apnea (adult) (pediatric): Secondary | ICD-10-CM | POA: Diagnosis present

## 2022-02-06 DIAGNOSIS — O285 Abnormal chromosomal and genetic finding on antenatal screening of mother: Secondary | ICD-10-CM | POA: Diagnosis present

## 2022-02-06 DIAGNOSIS — O2412 Pre-existing diabetes mellitus, type 2, in childbirth: Secondary | ICD-10-CM | POA: Diagnosis present

## 2022-02-06 DIAGNOSIS — O1002 Pre-existing essential hypertension complicating childbirth: Secondary | ICD-10-CM | POA: Diagnosis present

## 2022-02-06 DIAGNOSIS — O99214 Obesity complicating childbirth: Secondary | ICD-10-CM | POA: Diagnosis present

## 2022-02-06 DIAGNOSIS — Z6841 Body Mass Index (BMI) 40.0 and over, adult: Secondary | ICD-10-CM

## 2022-02-06 DIAGNOSIS — O114 Pre-existing hypertension with pre-eclampsia, complicating childbirth: Secondary | ICD-10-CM | POA: Diagnosis present

## 2022-02-06 DIAGNOSIS — I5030 Unspecified diastolic (congestive) heart failure: Secondary | ICD-10-CM | POA: Diagnosis present

## 2022-02-06 DIAGNOSIS — Z794 Long term (current) use of insulin: Secondary | ICD-10-CM | POA: Diagnosis not present

## 2022-02-06 DIAGNOSIS — Z30014 Encounter for initial prescription of intrauterine contraceptive device: Secondary | ICD-10-CM | POA: Diagnosis not present

## 2022-02-06 DIAGNOSIS — O99892 Other specified diseases and conditions complicating childbirth: Secondary | ICD-10-CM | POA: Diagnosis present

## 2022-02-06 DIAGNOSIS — R079 Chest pain, unspecified: Secondary | ICD-10-CM

## 2022-02-06 DIAGNOSIS — Z79899 Other long term (current) drug therapy: Secondary | ICD-10-CM | POA: Diagnosis not present

## 2022-02-06 DIAGNOSIS — Z20822 Contact with and (suspected) exposure to covid-19: Secondary | ICD-10-CM | POA: Diagnosis present

## 2022-02-06 DIAGNOSIS — I5032 Chronic diastolic (congestive) heart failure: Secondary | ICD-10-CM | POA: Diagnosis present

## 2022-02-06 DIAGNOSIS — Z3A35 35 weeks gestation of pregnancy: Secondary | ICD-10-CM | POA: Diagnosis not present

## 2022-02-06 DIAGNOSIS — Z9641 Presence of insulin pump (external) (internal): Secondary | ICD-10-CM | POA: Diagnosis present

## 2022-02-06 DIAGNOSIS — O3660X0 Maternal care for excessive fetal growth, unspecified trimester, not applicable or unspecified: Secondary | ICD-10-CM | POA: Diagnosis present

## 2022-02-06 DIAGNOSIS — E119 Type 2 diabetes mellitus without complications: Secondary | ICD-10-CM | POA: Diagnosis present

## 2022-02-06 DIAGNOSIS — Z8751 Personal history of pre-term labor: Secondary | ICD-10-CM

## 2022-02-06 DIAGNOSIS — O24424 Gestational diabetes mellitus in childbirth, insulin controlled: Secondary | ICD-10-CM | POA: Diagnosis not present

## 2022-02-06 DIAGNOSIS — O1414 Severe pre-eclampsia complicating childbirth: Secondary | ICD-10-CM | POA: Diagnosis present

## 2022-02-06 DIAGNOSIS — Z975 Presence of (intrauterine) contraceptive device: Secondary | ICD-10-CM

## 2022-02-06 DIAGNOSIS — O119 Pre-existing hypertension with pre-eclampsia, unspecified trimester: Secondary | ICD-10-CM | POA: Diagnosis present

## 2022-02-06 DIAGNOSIS — O0990 Supervision of high risk pregnancy, unspecified, unspecified trimester: Secondary | ICD-10-CM

## 2022-02-06 DIAGNOSIS — F332 Major depressive disorder, recurrent severe without psychotic features: Secondary | ICD-10-CM | POA: Diagnosis present

## 2022-02-06 HISTORY — DX: Body Mass Index (BMI) 40.0 and over, adult: Z684

## 2022-02-06 LAB — CBC WITH DIFFERENTIAL/PLATELET
Abs Immature Granulocytes: 0.13 10*3/uL — ABNORMAL HIGH (ref 0.00–0.07)
Abs Immature Granulocytes: 0.22 10*3/uL — ABNORMAL HIGH (ref 0.00–0.07)
Basophils Absolute: 0 10*3/uL (ref 0.0–0.1)
Basophils Absolute: 0 10*3/uL (ref 0.0–0.1)
Basophils Relative: 0 %
Basophils Relative: 0 %
Eosinophils Absolute: 0 10*3/uL (ref 0.0–0.5)
Eosinophils Absolute: 0 10*3/uL (ref 0.0–0.5)
Eosinophils Relative: 0 %
Eosinophils Relative: 0 %
HCT: 36 % (ref 36.0–46.0)
HCT: 36 % (ref 36.0–46.0)
Hemoglobin: 11.4 g/dL — ABNORMAL LOW (ref 12.0–15.0)
Hemoglobin: 12 g/dL (ref 12.0–15.0)
Immature Granulocytes: 1 %
Immature Granulocytes: 2 %
Lymphocytes Relative: 28 %
Lymphocytes Relative: 24 %
Lymphs Abs: 2.8 10*3/uL (ref 0.7–4.0)
Lymphs Abs: 3 10*3/uL (ref 0.7–4.0)
MCH: 27 pg (ref 26.0–34.0)
MCH: 27.6 pg (ref 26.0–34.0)
MCHC: 31.7 g/dL (ref 30.0–36.0)
MCHC: 33.3 g/dL (ref 30.0–36.0)
MCV: 85.1 fL (ref 80.0–100.0)
MCV: 82.9 fL (ref 80.0–100.0)
Monocytes Absolute: 1 10*3/uL (ref 0.1–1.0)
Monocytes Absolute: 1.1 10*3/uL — ABNORMAL HIGH (ref 0.1–1.0)
Monocytes Relative: 9 %
Monocytes Relative: 9 %
Neutro Abs: 6.2 10*3/uL (ref 1.7–7.7)
Neutro Abs: 8.3 10*3/uL — ABNORMAL HIGH (ref 1.7–7.7)
Neutrophils Relative %: 62 %
Neutrophils Relative %: 65 %
Platelets: 285 10*3/uL (ref 150–400)
Platelets: 311 10*3/uL (ref 150–400)
RBC: 4.23 MIL/uL (ref 3.87–5.11)
RBC: 4.34 MIL/uL (ref 3.87–5.11)
RDW: 16.1 % — ABNORMAL HIGH (ref 11.5–15.5)
RDW: 16.1 % — ABNORMAL HIGH (ref 11.5–15.5)
WBC: 10.3 10*3/uL (ref 4.0–10.5)
WBC: 12.7 10*3/uL — ABNORMAL HIGH (ref 4.0–10.5)
nRBC: 0 % (ref 0.0–0.2)
nRBC: 0 % (ref 0.0–0.2)

## 2022-02-06 LAB — COMPREHENSIVE METABOLIC PANEL
ALT: 15 U/L (ref 0–44)
ALT: 16 U/L (ref 0–44)
AST: 18 U/L (ref 15–41)
AST: 34 U/L (ref 15–41)
Albumin: 2.5 g/dL — ABNORMAL LOW (ref 3.5–5.0)
Albumin: 2.4 g/dL — ABNORMAL LOW (ref 3.5–5.0)
Alkaline Phosphatase: 46 U/L (ref 38–126)
Alkaline Phosphatase: 45 U/L (ref 38–126)
Anion gap: 11 (ref 5–15)
Anion gap: 9 (ref 5–15)
BUN: 7 mg/dL (ref 6–20)
BUN: 6 mg/dL (ref 6–20)
CO2: 22 mmol/L (ref 22–32)
CO2: 21 mmol/L — ABNORMAL LOW (ref 22–32)
Calcium: 8.9 mg/dL (ref 8.9–10.3)
Calcium: 8.4 mg/dL — ABNORMAL LOW (ref 8.9–10.3)
Chloride: 104 mmol/L (ref 98–111)
Chloride: 105 mmol/L (ref 98–111)
Creatinine, Ser: 0.61 mg/dL (ref 0.44–1.00)
Creatinine, Ser: 0.52 mg/dL (ref 0.44–1.00)
GFR, Estimated: >60 mL/min (ref >60)
GFR, Estimated: >60 mL/min (ref >60)
Glucose, Bld: 160 mg/dL — ABNORMAL HIGH (ref 70–99)
Glucose, Bld: 114 mg/dL — ABNORMAL HIGH (ref 70–99)
Potassium: 3.8 mmol/L (ref 3.5–5.1)
Potassium: 3.7 mmol/L (ref 3.5–5.1)
Sodium: 137 mmol/L (ref 135–145)
Sodium: 135 mmol/L (ref 135–145)
Total Bilirubin: 0.6 mg/dL (ref 0.3–1.2)
Total Bilirubin: 1.2 mg/dL (ref 0.3–1.2)
Total Protein: 6.2 g/dL — ABNORMAL LOW (ref 6.5–8.1)
Total Protein: 6 g/dL — ABNORMAL LOW (ref 6.5–8.1)

## 2022-02-06 LAB — GLUCOSE, CAPILLARY
Glucose-Capillary: 124 mg/dL — ABNORMAL HIGH (ref 70–99)
Glucose-Capillary: 108 mg/dL — ABNORMAL HIGH (ref 70–99)
Glucose-Capillary: 113 mg/dL — ABNORMAL HIGH (ref 70–99)
Glucose-Capillary: 110 mg/dL — ABNORMAL HIGH (ref 70–99)
Glucose-Capillary: 118 mg/dL — ABNORMAL HIGH (ref 70–99)

## 2022-02-06 LAB — PROTEIN / CREATININE RATIO, URINE
Creatinine, Urine: 159.32 mg/dL
Protein Creatinine Ratio: 2.22 mg/mg{Cre} — ABNORMAL HIGH (ref 0.00–0.15)
Total Protein, Urine: 353 mg/dL

## 2022-02-06 LAB — TYPE AND SCREEN
ABO/RH(D): O POS
Antibody Screen: NEGATIVE

## 2022-02-06 LAB — RPR: RPR Ser Ql: NONREACTIVE

## 2022-02-06 LAB — MAGNESIUM: Magnesium: 3.2 mg/dL — ABNORMAL HIGH (ref 1.7–2.4)

## 2022-02-06 MED ORDER — MISOPROSTOL 50MCG HALF TABLET
50.0000 ug | ORAL_TABLET | ORAL | Status: DC
  Administered 2022-02-06 (×2): 50 ug via BUCCAL
  Filled 2022-02-06 (×2): qty 1

## 2022-02-06 MED ORDER — OXYTOCIN BOLUS FROM INFUSION
333.0000 mL | Freq: Once | INTRAVENOUS | Status: AC
  Administered 2022-02-07: 333 mL via INTRAVENOUS

## 2022-02-06 MED ORDER — LABETALOL HCL 5 MG/ML IV SOLN: 40.0000 mg | INTRAVENOUS | Status: DC | PRN

## 2022-02-06 MED ORDER — OXYTOCIN-SODIUM CHLORIDE 30-0.9 UT/500ML-% IV SOLN
1.0000 m[IU]/min | INTRAVENOUS | Status: DC
  Administered 2022-02-06: 2 m[IU]/min via INTRAVENOUS

## 2022-02-06 MED ORDER — EPHEDRINE 5 MG/ML INJ: 10.0000 mg | INTRAVENOUS | Status: DC | PRN

## 2022-02-06 MED ORDER — HYDRALAZINE HCL 20 MG/ML IJ SOLN: 10.0000 mg | INTRAMUSCULAR | Status: DC | PRN

## 2022-02-06 MED ORDER — FUROSEMIDE 80 MG PO TABS
80.0000 mg | ORAL_TABLET | Freq: Two times a day (BID) | ORAL | Status: DC
  Administered 2022-02-06 – 2022-02-09 (×5): 80 mg via ORAL
  Filled 2022-02-06 (×9): qty 1

## 2022-02-06 MED ORDER — LABETALOL HCL 5 MG/ML IV SOLN: 20.0000 mg | INTRAVENOUS | Status: DC | PRN

## 2022-02-06 MED ORDER — LACTATED RINGERS IV SOLN: 500.0000 mL | Freq: Once | INTRAVENOUS | Status: DC

## 2022-02-06 MED ORDER — LABETALOL HCL 5 MG/ML IV SOLN: 80.0000 mg | INTRAVENOUS | Status: DC | PRN

## 2022-02-06 MED ORDER — ACETAMINOPHEN 325 MG PO TABS: 650.0000 mg | ORAL_TABLET | ORAL | Status: DC | PRN

## 2022-02-06 MED ORDER — OXYCODONE-ACETAMINOPHEN 5-325 MG PO TABS
1.0000 | ORAL_TABLET | ORAL | Status: DC | PRN
  Administered 2022-02-06 – 2022-02-07 (×2): 1 via ORAL
  Filled 2022-02-06 (×2): qty 1

## 2022-02-06 MED ORDER — DEXTROSE 50 % IV SOLN: 0.0000 mL | INTRAVENOUS | Status: DC | PRN

## 2022-02-06 MED ORDER — MAGNESIUM SULFATE BOLUS VIA INFUSION
4.0000 g | Freq: Once | INTRAVENOUS | Status: AC
  Administered 2022-02-06: 4 g via INTRAVENOUS
  Filled 2022-02-06: qty 1000

## 2022-02-06 MED ORDER — PENICILLIN G POT IN DEXTROSE 60000 UNIT/ML IV SOLN
3.0000 10*6.[IU] | INTRAVENOUS | Status: DC
  Administered 2022-02-06 – 2022-02-07 (×3): 3 10*6.[IU] via INTRAVENOUS
  Filled 2022-02-06 (×5): qty 50

## 2022-02-06 MED ORDER — ACETAMINOPHEN 500 MG PO TABS
1000.0000 mg | ORAL_TABLET | Freq: Once | ORAL | Status: AC
  Administered 2022-02-06: 1000 mg via ORAL
  Filled 2022-02-06: qty 2

## 2022-02-06 MED ORDER — DIPHENHYDRAMINE HCL 50 MG/ML IJ SOLN: 12.5000 mg | INTRAMUSCULAR | Status: DC | PRN

## 2022-02-06 MED ORDER — TERBUTALINE SULFATE 1 MG/ML IJ SOLN: 0.2500 mg | Freq: Once | INTRAMUSCULAR | Status: DC | PRN

## 2022-02-06 MED ORDER — FENTANYL CITRATE (PF) 100 MCG/2ML IJ SOLN
100.0000 ug | Freq: Once | INTRAMUSCULAR | Status: AC
  Administered 2022-02-06: 100 ug via INTRAVENOUS
  Filled 2022-02-06: qty 2

## 2022-02-06 MED ORDER — DEXTROSE IN LACTATED RINGERS 5 % IV SOLN: INTRAVENOUS | Status: DC

## 2022-02-06 MED ORDER — OXYCODONE-ACETAMINOPHEN 5-325 MG PO TABS: 2.0000 | ORAL_TABLET | ORAL | Status: DC | PRN

## 2022-02-06 MED ORDER — LACTATED RINGERS IV SOLN: 500.0000 mL | INTRAVENOUS | Status: DC | PRN

## 2022-02-06 MED ORDER — PHENYLEPHRINE 40 MCG/ML (10ML) SYRINGE FOR IV PUSH (FOR BLOOD PRESSURE SUPPORT)
80.0000 ug | PREFILLED_SYRINGE | INTRAVENOUS | Status: DC | PRN
  Administered 2022-02-07: 80 ug via INTRAVENOUS

## 2022-02-06 MED ORDER — SODIUM CHLORIDE 0.9 % IV SOLN
INTRAVENOUS | Status: DC
  Filled 2022-02-06: qty 1000

## 2022-02-06 MED ORDER — LIDOCAINE HCL (PF) 1 % IJ SOLN: 30.0000 mL | INTRAMUSCULAR | Status: DC | PRN

## 2022-02-06 MED ORDER — MAGNESIUM SULFATE 40 GM/1000ML IV SOLN
2.0000 g/h | INTRAVENOUS | Status: DC
  Administered 2022-02-06: 2 g/h via INTRAVENOUS
  Filled 2022-02-06 (×2): qty 1000

## 2022-02-06 MED ORDER — LACTATED RINGERS IV SOLN
INTRAVENOUS | Status: DC
  Administered 2022-02-06: 125 mL/h via INTRAVENOUS

## 2022-02-06 MED ORDER — PHENYLEPHRINE 40 MCG/ML (10ML) SYRINGE FOR IV PUSH (FOR BLOOD PRESSURE SUPPORT)
80.0000 ug | PREFILLED_SYRINGE | INTRAVENOUS | Status: DC | PRN
  Filled 2022-02-06: qty 10

## 2022-02-06 MED ORDER — HYDRALAZINE HCL 50 MG PO TABS
25.0000 mg | ORAL_TABLET | Freq: Two times a day (BID) | ORAL | Status: DC
  Administered 2022-02-07: 25 mg via ORAL
  Filled 2022-02-06 (×3): qty 1

## 2022-02-06 MED ORDER — LABETALOL HCL 200 MG PO TABS
200.0000 mg | ORAL_TABLET | Freq: Two times a day (BID) | ORAL | Status: DC
  Administered 2022-02-07: 200 mg via ORAL
  Filled 2022-02-06: qty 1

## 2022-02-06 MED ORDER — FENTANYL CITRATE (PF) 100 MCG/2ML IJ SOLN
50.0000 ug | Freq: Once | INTRAMUSCULAR | Status: AC
  Administered 2022-02-06: 50 ug via INTRAVENOUS
  Filled 2022-02-06: qty 2

## 2022-02-06 MED ORDER — LACTATED RINGERS IV SOLN: INTRAVENOUS | Status: DC

## 2022-02-06 MED ORDER — OXYTOCIN-SODIUM CHLORIDE 30-0.9 UT/500ML-% IV SOLN
2.5000 [IU]/h | INTRAVENOUS | Status: DC
  Administered 2022-02-07: 2.5 [IU]/h via INTRAVENOUS
  Filled 2022-02-06: qty 500

## 2022-02-06 MED ORDER — INSULIN REGULAR(HUMAN) IN NACL 100-0.9 UT/100ML-% IV SOLN
INTRAVENOUS | Status: DC
  Administered 2022-02-06: 3 [IU]/h via INTRAVENOUS
  Filled 2022-02-06: qty 100

## 2022-02-06 MED ORDER — SODIUM CHLORIDE 0.9 % IV SOLN
5.0000 10*6.[IU] | Freq: Once | INTRAVENOUS | Status: AC
  Administered 2022-02-06: 5 10*6.[IU] via INTRAVENOUS
  Filled 2022-02-06: qty 5

## 2022-02-06 MED ORDER — FENTANYL-BUPIVACAINE-NACL 0.5-0.125-0.9 MG/250ML-% EP SOLN
12.0000 mL/h | EPIDURAL | Status: DC | PRN
  Administered 2022-02-06: 12 mL/h via EPIDURAL
  Filled 2022-02-06: qty 250

## 2022-02-06 MED ORDER — LIDOCAINE HCL (PF) 1 % IJ SOLN
INTRAMUSCULAR | Status: DC | PRN
  Administered 2022-02-06: 8 mL via EPIDURAL

## 2022-02-06 MED ORDER — SOD CITRATE-CITRIC ACID 500-334 MG/5ML PO SOLN: 30.0000 mL | ORAL | Status: DC | PRN

## 2022-02-06 MED ORDER — ONDANSETRON HCL 4 MG/2ML IJ SOLN
4.0000 mg | Freq: Four times a day (QID) | INTRAMUSCULAR | Status: DC | PRN
  Administered 2022-02-07: 4 mg via INTRAVENOUS
  Filled 2022-02-06: qty 2

## 2022-02-06 NOTE — Progress Notes (Signed)
Interim Progress Note:  ? ?Labor: Checked cervix once arrived to L&D, 0.5cm/thick/-3. After verbal consent, able to place a FB and patient tolerated well. With placement, cervix actually was opening more to a soft 1 cm. Placed buccal cytotec.  ? ?cHTN with SI Pre-e with SF: Headache still present despite tylenol. Will give oxycodone 5mg  and reassess (considered Fioricet but just received tylenol 1000mg ). Neurologically intact. Pre-e labs WNL, p/cr ratio elevated. Started Mg infusion and will have home labetalol/hydralazine (may hold if needed).  ? ?T2DM: Insulin dependent. Last a1c 7.2% in 12/2021. Does not follow with endocrinology  currently or PCP closely for diabetic control. CBG 160 on admit. Using insulin pump with about 60 U daily. Plan to transition over to tho Endo tool for consistent monitoring of CBGs while in labor.  ? ?HFpEF: Follows with Cardiology. Last seen 2/24. Taking lasix 80 mg twice daily since November. Appears to be at her baseline volume wise. Abdomen appears edematous with peau d'orange appearance, but otherwise LE without significant pitting.  ? ?BMI 74: Difficulty with monitoring FHT. Patient is okay with placing internal monitors as soon as we can safely AROM after FB falls out.  ? ?Contraception: Desires post-placenta IUD. Consent will be signed.  ? ?3/24, DO  ? ? ? ? ?

## 2022-02-06 NOTE — H&P (Signed)
[name] is a 36 y.o. [phone] female at [redacted]w[redacted]d presenting for evaluation of preeclamptic symptoms. She had a BP reading of 176/110 at home this morning. Has had a severe headache (7/10) with blurred vision for the past three days that is worse with movement and unresolved by two separate doses of 1000mg  Tylenol, SOB especially when moving and chest pain that feels like "bands across my chest" that is also aggravated by movement. She has a history of cHTN managed by 200mg  labetalol BID, 80mg  Lasix BID and daily baby aspirin (all of which she took this morning). Has noted decreased urine output for the past 24hrs despite lasix dosing.  ?OB History   ? ? Gravida  ?5  ? Para  ?3  ? Term  ?2  ? Preterm  ?1  ? AB  ?1  ? Living  ?3  ?  ? ? SAB  ?1  ? IAB  ?   ? Ectopic  ?   ? Multiple  ?   ? Live Births  ?2  ?   ?  ?  ? ?Past Medical History:  ?Diagnosis Date  ? Anxiety   ? Bipolar disorder (HCC)   ? CHF (congestive heart failure) (HCC)   ? Depression   ? Diabetes mellitus without complication (HCC)   ? GERD (gastroesophageal reflux disease)   ? Heart failure (HCC)   ? HPV (human papilloma virus) infection   ? Hypercholesteremia   ? Hypertension   ? Ovarian cyst   ? Vaginal Pap smear, abnormal   ? ?Past Surgical History:  ?Procedure Laterality Date  ? CHOLECYSTECTOMY    ? SALPINGECTOMY Left   ? Benign growth  ? ?Family History: family history includes Asthma in her son; COPD in her mother; Depression in her mother; Diabetes in her mother; Heart disease in her mother and son. ?Social History:  reports that she has never smoked. She has never used smokeless tobacco. She reports that she does not drink alcohol and does not use drugs. ? ?  ?Maternal Diabetes: Yes:  Diabetes Type:  Pre-pregnancy, Insulin/Medication controlled ?Genetic Screening: Normal ?Maternal Ultrasounds/Referrals: Normal ?Fetal Ultrasounds or other Referrals:  None, Fetal echo, Referred to Materal Fetal Medicine , Other: echo ordered but never  completed ?Maternal Substance Abuse:  No ?Significant Maternal Medications:  Meds include: Progesterone ?Significant Maternal Lab Results:  Other: GBS pending, baseline proteinuria ?Other Comments:  None ? ?Review of Systems ?Maternal Medical History:  ?Fetal activity: Perceived fetal activity is normal.   ?Last perceived fetal movement was within the past hour.   ?Prenatal complications: PIH and pre-eclampsia.   ?Prenatal Complications - Diabetes: type 2. ?Diabetes is managed by insulin pump.   ? ?  ?Blood pressure (!) 162/92, pulse 96, temperature 98.4 ?F (36.9 ?C), resp. rate (!) 28, last menstrual period 06/04/2021, SpO2 97 %, unknown if currently breastfeeding. ?Maternal Exam:  ?Abdomen: Patient reports no abdominal tenderness. Fetal presentation: vertex ?Confirmed by bedside U/S ?Introitus: Vulva is positive for vulval edema. Amniotic fluid character: not assessed. ?Pelvis: adequate for delivery.   ? ? ?Fetal Exam ?Fetal Monitor Review: Mode: ultrasound.   ?Baseline rate: 145.  ?Variability: minimal (<5 bpm).   ?Pattern: accelerations present and no decelerations.   ?Fetal State Assessment: Category I - tracings are normal. ? ?Physical Exam ?Vitals and nursing note reviewed.  ?Constitutional:   ?   General: She is in acute distress (mild distress when laid down due to SOB).  ?   Appearance: She is  well-developed. She is obese. She is ill-appearing and diaphoretic.  ?   Comments: Severe dependent edema to lower abdomen, mons pubis, vulva and inner thighs.  ?HENT:  ?   Head: Normocephalic and atraumatic.  ?Eyes:  ?   Pupils: Pupils are equal, round, and reactive to light.  ?Cardiovascular:  ?   Rate and Rhythm: Normal rate and regular rhythm.  ?Pulmonary:  ?   Effort: Tachypnea present.  ?Abdominal:  ? ? ?Musculoskeletal:     ?   General: Normal range of motion.  ?Skin: ?   General: Skin is warm.  ?   Capillary Refill: Capillary refill takes less than 2 seconds.  ?Neurological:  ?   Mental Status: She is alert  and oriented to person, place, and time.  ?Psychiatric:     ?   Mood and Affect: Mood normal.     ?   Behavior: Behavior normal.  ?  ?Prenatal labs: ?ABO, Rh: O/Positive/-- (09/13 0000) ?Antibody: Negative (09/13 0000) ?Rubella: Immune (09/13 0000) ?RPR: Non Reactive (01/25 1407)  ?HBsAg: Negative (09/13 0000)  ?HIV: Non Reactive (01/25 1407)  ?GBS:  PCR pending ? ?Assessment/Plan: ?Admit to L&D for IOL for PEC w SF ?- discussed presentation with Drs. Bass & Ozan who agreed pt needs to be admitted for immediate IOL ?PEC: labetalol protocol ordered + mag bolus/infusion ?- CXR ordered to assess for possible fluid build up in lungs given SOB, chest pain and O2 saturation in mid-low 90s at rest ?DM2: will convert to endotool ?Vertex presentation confirmed with bedside U/S ?Cervical check on L&D, likely to start with buccal cytotec ?  ?Bernerd Limbo ?02/06/2022, 11:25 AM ? ? ? ? ?

## 2022-02-06 NOTE — Anesthesia Preprocedure Evaluation (Signed)
Anesthesia Evaluation  ?Patient identified by MRN, date of birth, ID band ?Patient awake ? ? ? ?Reviewed: ?Allergy & Precautions, NPO status , Patient's Chart, lab work & pertinent test results ? ?Airway ?Mallampati: III ? ?TM Distance: >3 FB ?Neck ROM: Full ? ? ? Dental ?no notable dental hx. ? ?  ?Pulmonary ?shortness of breath, at rest and lying, sleep apnea ,  ? On O2 ? ? ?+ decreased breath sounds ? ? ? ? ? Cardiovascular ?hypertension, Pt. on medications ?+CHF  ?Normal cardiovascular exam ?Rhythm:Regular Rate:Normal ? ? ?  ?Neuro/Psych ?PSYCHIATRIC DISORDERS Anxiety Depression Bipolar Disorder negative neurological ROS ?   ? GI/Hepatic ?Neg liver ROS, GERD  ,  ?Endo/Other  ?diabetes, Type 2, Insulin DependentHypothyroidism Morbid obesity ? Renal/GU ?negative Renal ROS  ?negative genitourinary ?  ?Musculoskeletal ?negative musculoskeletal ROS ?(+)  ? Abdominal ?(+) + obese,   ?Peds ?negative pediatric ROS ?(+)  Hematology ? ?(+) Blood dyscrasia, anemia ,   ?Anesthesia Other Findings ?Diffuse pitting edema ? Reproductive/Obstetrics ?negative OB ROS ? ?  ? ? ? ? ? ? ? ? ? ? ? ? ? ?  ?  ? ? ? ? ? ? ? ? ?Anesthesia Physical ?Anesthesia Plan ? ?ASA: 4 ? ?Anesthesia Plan: Epidural  ? ?Post-op Pain Management:   ? ?Induction:  ? ?PONV Risk Score and Plan: 2 and Treatment may vary due to age or medical condition ? ?Airway Management Planned: Natural Airway ? ?Additional Equipment: None ? ?Intra-op Plan:  ? ?Post-operative Plan:  ? ?Informed Consent: I have reviewed the patients History and Physical, chart, labs and discussed the procedure including the risks, benefits and alternatives for the proposed anesthesia with the patient or authorized representative who has indicated his/her understanding and acceptance.  ? ? ? ? ? ?Plan Discussed with: Anesthesiologist ? ?Anesthesia Plan Comments:   ? ? ? ? ? ? ?Anesthesia Quick Evaluation ? ?

## 2022-02-06 NOTE — Progress Notes (Signed)
Labor Progress Note ?[name] is a 36 y.o. [phone] at [redacted]w[redacted]d presented for IOL d/t pre-e with severe features ?S: Patient is uncomfortable with contractions, doesn't feel the fentanyl helps much. Foley balloon out. ? ?O:  ?BP (!) 149/81   Pulse (!) 102   Temp 98.6 ?F (37 ?C) (Oral)   Resp 18   Ht 5\' 7"  (1.702 m)   Wt (!) 211.4 kg   LMP 06/04/2021   SpO2 95%   BMI 72.99 kg/m?  ?EFM: baseline 140s/accels present/no decels ; difficultly picking up external tracings consistently ? ?CVE: Dilation: 4 ?Effacement (%): Thick ?Station: -3 ?Presentation: Vertex ?Exam by:: [email or web address], MD ? ? ?A&P: 36 y.o. 30 [redacted]w[redacted]d  ?#Labor: Progressing well. S/p 2 buccal cytotec and FB which is now out. Plan for AROM and internal monitoring in future checks.  ?#Pain: Plan for epidural soon, currently getting fentanyl and  ?#FWB: Cat I ?#GBS positive in urine receiving PCN ? ?#Pre-E with SF ?BP currently 130s-140s systolics. Monitoring UOP closely. Plan for TXA at delivery ?-Home labetalol 200 mg BID ?-Added back home hydralazine 25 mg BID ?-Lasix 80 mg BID ?-Mag gtt ?-Recheck labs CMP, CBC, Mag ? ?#T2DM ?On endotool last CBG 110, will monitor. ? ?#HFpEF ?-Lasix 80 mg BID ? ?#OSA ?On 2L O2 currently, has iWOB when laying flat. At home uses CPAP at setting of 10 ?-Plan for RT for CPAP set up tonight ? ?[redacted]w[redacted]d, MD ?[facility] ?9:39 PM  ?

## 2022-02-06 NOTE — Progress Notes (Addendum)
Labor Progress Note ?[name] is a 36 y.o. [phone] at [redacted]w[redacted]d presented for IOL d/t pre-e with severe features ?S: Patient is more comfortable with epidural in place ? ?O:  ?BP 125/63   Pulse (!) 101   Temp 98.6 ?F (37 ?C) (Oral)   Resp 18   Ht 5\' 7"  (1.702 m)   Wt (!) 211.4 kg   LMP 06/04/2021   SpO2 95%   BMI 72.99 kg/m?  ?EFM: baseline 150s/+accels, no decels ? ?CVE: Dilation: 4.5 ?Station: -3 ?Presentation: Vertex ?Exam by:: [email or web address], MD ? ? ?A&P: 36 y.o. 30 [redacted]w[redacted]d  ?#Labor: Progressing well. S/p 2 buccal cytotec and FB. AROM performed in room with clear fluid, FSE and IUPC placed. Procedure tolerated well by patient and fetus. Will start pitocin 2x2. ?#Pain: Epidural in place ?#FWB: Cat I ?#GBS positive in urine receiving PCN ? ?#cHTN with superimposed Pre-E with SF ?BP currently 125-135 systolics recently. Monitoring UOP, foley in place with good output in room. Plan for TXA at delivery. 10 PM preE Labs showed creatinine stable, no transaminitis, magnesium level 3.2- continue at 2g/hr. Headache better than before no severe vision changes.  ?-Continue home labetalol 200 mg BID ?-Added back home hydralazine 25 mg BID ?-Lasix 80 mg BID ?-Continue Mag gtt for seizure PPX ? ?#T2DM ?On endotool last CBG 118, will continue to monitor. ? ?#HFpEF ?-Lasix 80 mg BID ? ?#OSA ?On 2L O2 currently, has iWOB when laying flat. At home uses CPAP at setting of 10. Discussed option of calling RT to set up CPAP for patient. At this time patient wants to continue oxygen for now and hold off on CPAP ? ?[redacted]w[redacted]d, MD ?[facility], [facility] ?11:26 PM  ? ?GME ATTESTATION:  ?I saw and evaluated the patient. I agree with the findings and the plan of care as documented in the resident?s note and have made all necessary edits. I was present and performed the AROM, IUPC, and FSE placement. ? ?[facility], MD, MPH ?OB Fellow, Faculty Practice ?[place], [facility] ?02/06/2022 11:40 PM ? ?

## 2022-02-06 NOTE — Progress Notes (Signed)
Labor Progress Note ?[name] is a 36 y.o. [phone] at [redacted]w[redacted]d presented for IOL due to pre-e with SF.  ? ?S: Starting to get more uncomfortable with cramping/contractions. Using IV fent.  ? ?O:  ?BP 129/80   Pulse (!) 104   Temp 98.6 ?F (37 ?C) (Oral)   Resp (!) 22   Ht 5\' 7"  (1.702 m)   Wt (!) 211.4 kg   LMP 06/04/2021   SpO2 96%   BMI 72.99 kg/m?  ?EFM: 135/mod/15x15/none  ? ?CVE: Dilation: Fingertip ?Effacement (%): Thick ?Station: -3 ?Presentation: Vertex ?Exam by:: MD [email or web address] ? ? ?A&P: 36 y.o. 30 [redacted]w[redacted]d  ?#Labor: FB still in place after 4 hours, placed second buccal cytotec around 1840.  ?#Pain: PRN, IV fent for now, planning for epidural  ?#FWB: Cat I ( Intermittent difficulty tracing due to body habitus)  ?#GBS positive in urine, PCN  ? ?#Pre-e with SF: BP mild range currently. HA improving, no other symptoms. Known history of OSA using CPAP at home-- thus on 2L whenever laying flat + sleeping. Pre-e labs WNL. Cont mag and home labetalol, currently holding home hydralazine.  ? ?T2DM: On endotool with appropriate CBGs. No hypoglycemic symptoms.  ? ?#HFpEF: at baseline fluid status. Given pm home lasix.  ? ?[redacted]w[redacted]d, DO ?7:33 PM  ?

## 2022-02-06 NOTE — Progress Notes (Addendum)
Inpatient Diabetes Program Recommendations ? ?AACE/ADA: New Consensus Statement on Inpatient Glycemic Control (2015) ? ?Target Ranges:  Prepandial:   less than 140 mg/dL ?     Peak postprandial:   less than 180 mg/dL (1-2 hours) ?     Critically ill patients:  140 - 180 mg/dL  ? ?Lab Results  ?Component Value Date  ? GLUCAP 170 (H) 05/27/2017  ? HGBA1C 7.2 (H) 12/31/2021  ? ? ?Review of Glycemic Control ? Latest Reference Range & Units 02/06/22 11:30  ?Glucose 70 - 99 mg/dL 160 (H)  ?(H): Data is abnormally high ? ?Diabetes history:DM2 ?Outpatient Diabetes medications: DM2 ?Omnipod insulin pump ?No correction settings ?Basal 2.5 units/hour (60 units daily) ?She boluses for meals and snacks (was unable to find those settings in pump) ?Current orders for Inpatient glycemic control: IV insulin ? ?Recommend IV insulin ? ?Spoke with patient at bedside.  She wears an Omnipod; her PCP started her on this pump >2 yrs ago.  She has not had any pump adjustments in > 1 yr.  Last A1C was 7.2% (average blood glucose of 160 mg/dL).  She used to wear a [employer] which she liked.  Her PCP and pharmacy told her that Medicaid does not cover the [place].  Explained to patient that it is covered by Medicaid and we can provide her with samples at discharge and the Endocrinology office will help her with obtaining those.  She had an appointment with [facility] today for her first appointment.  She was unable to make the appointment due to admission for IOL.  She will follow up after delivery.   ? ?Current with [facility] and [facility]. ? ?She wants to wear her pump during labor.  Explained importance of optimal glucose control during labor, hormonal shifts once placenta is delivered & decreasing insulin needs, complications of uncontrolled glucose on fetus and increased risk for hypoglycemia with infant.  She verbalizes understanding and agrees with IV insulin.   ? ?[employer] with verbal order from Dr. Elgie Congo and Higinio Plan.   Spoke with Dr. Elgie Congo, Nelda Marseille and Higinio Plan regarding patient.   ? ?Recommendations for after delivery: ? ?1-Levemir 10 BID at delivery when IV insulin is stopped ?2-Novolog correction TID and HS ?3-Carb modified diet ?4-Would not recommend placing pump back on until she sees endocrinology ? ?Addendum@[id number] : ?Spoke with Sonia Baller, RN.  CGM fell out.  Recommend finger sticks while on Endotool.  Patient tells RN she has stopped her insulin pump. ? ?Will continue to follow while inpatient. ? ?Thank you, ?Reche Dixon, MSN, RN ?Diabetes Coordinator ?Inpatient Diabetes Program ?[phone] (team pager from 8a-5p) ? ? ? ? ? ? ? ? ?

## 2022-02-06 NOTE — Anesthesia Procedure Notes (Signed)
Epidural ?Patient location during procedure: OB ?Start time: 02/06/2022 9:50 PM ?End time: 02/06/2022 10:00 PM ? ?Staffing ?Anesthesiologist: Merlinda Frederick, MD ?Performed: anesthesiologist  ? ?Preanesthetic Checklist ?Completed: patient identified, IV checked, site marked, risks and benefits discussed, monitors and equipment checked, pre-op evaluation and timeout performed ? ?Epidural ?Patient position: sitting ?Prep: DuraPrep ?Patient monitoring: heart rate, cardiac monitor, continuous pulse ox and blood pressure ?Approach: midline ?Location: L2-L3 ?Injection technique: LOR saline ? ?Needle:  ?Needle type: Tuohy  ?Needle gauge: 17 G ?Needle length: 9 cm ?Needle insertion depth: 9 cm ?Catheter type: closed end flexible ?Catheter size: 20 Guage ?Catheter at skin depth: 15 cm ?Test dose: negative and Other ? ?Assessment ?Events: blood not aspirated, injection not painful, no injection resistance and negative IV test ? ?Additional Notes ?Informed consent obtained prior to proceeding including risk of failure, 1% risk of PDPH, risk of minor discomfort and bruising.  Discussed rare but serious complications including epidural abscess, permanent nerve injury, epidural hematoma.  Discussed alternatives to epidural analgesia and patient desires to proceed.  Timeout performed pre-procedure verifying patient name, procedure, and platelet count.  Patient tolerated procedure well. ? ? ? ? ?

## 2022-02-06 NOTE — Telephone Encounter (Signed)
Call transferred from front office stating patient is on the line crying and stating something is wrong. I spoke with patient on the phone- patient tearful stating she thinks she is developing pre-eclampsia. Patient states she has not felt right over the last day or two. She states she has developed headache, vision changes, shortness of breath and "just overall doesn't feel right." Per patient no matter how much water she drinks her urine remains very concentrated. Patient states her blood pressures have been elevated, her blood pressure this morning at home was 176/110. I recommended patient go to MAU for evaluation. Patient verbalized understanding and stated her husband would bring her to MAU. ? ?Paulina Fusi, RN ?02/06/22  ?

## 2022-02-06 NOTE — MAU Note (Incomplete)
.  [name] is a 36 y.o. at [redacted]w[redacted]d here in MAU reporting: that she has not felt right for the past few days. Has had increased shortness or breath with minimal activity. Feels increased swelling in hands and feet and numbness in her arms. Stated her b/p has been running 170's over 110 for the past few days.C/o headache for the past 3 days. Reports decreased fetal movement for the  past 12 hrs.  ?LMP:  ?Onset of complaint: 3 days ago ?Pain score: *** ?There were no vitals filed for this visit.   ?FHT:*** ?Lab orders placed from triage:  U/A ? ?

## 2022-02-06 NOTE — Progress Notes (Deleted)
?Name: [name]  ?MRN/ DOB: [id number], May 22, 1986   ?Age/ Sex: 36 y.o., female   ? ?PCP: Bonnita Nasuti, MD   ?Reason for Endocrinology Evaluation: Type 2 Diabetes Mellitus  ?   ?Date of Initial Endocrinology Visit: 02/06/2022   ? ? ?PATIENT IDENTIFIER: [name] is a 36 y.o. female with a past medical history of T2DM, CHF, GERD. The patient presented for initial endocrinology clinic visit on 02/06/2022 for consultative assistance with her diabetes management.  ? ? ?HPI: ?[name] was  ? ? ?Diagnosed with DM *** ?Prior Medications tried/Intolerance: *** ?Currently checking blood sugars *** x / day,  before breakfast and ***.  ?Hypoglycemia episodes : ***               Symptoms: ***                 Frequency: ***/  ?Hemoglobin A1c has ranged from *** in ***, peaking at *** in ***. ?Patient required assistance for hypoglycemia:  ?Patient has required hospitalization within the last 1 year from hyper or hypoglycemia:  ? ?In terms of diet, the patient *** ? ?She is currently at 35 weeks of gestation  ? ? ?HOME DIABETES REGIMEN: ?Novolog  ? ? ?Statin: no ?ACE-I/ARB: no ?Prior Diabetic Education: yes ? ? ?METER DOWNLOAD SUMMARY: Date range evaluated: *** ?Fingerstick Blood Glucose Tests = *** ?Average Number Tests/Day = *** ?Overall Mean FS Glucose = *** ?Standard Deviation = *** ? ?BG Ranges: ?Low = *** ?High = *** ? ? ?Hypoglycemic Events/30 Days: ?BG < 50 = *** ?Episodes of symptomatic severe hypoglycemia = *** ? ? ?DIABETIC COMPLICATIONS: ?Microvascular complications:  ?*** ?Denies: *** ?Last eye exam: Completed  ? ?Macrovascular complications:  ? ?Denies: CAD, PVD, CVA ? ? ?PAST HISTORY: ?Past Medical History:  ?Past Medical History:  ?Diagnosis Date  ? Anxiety   ? Bipolar disorder ([place])   ? CHF (congestive heart failure) ([place])   ? Depression   ? Diabetes mellitus without complication ([place])   ? GERD (gastroesophageal reflux disease)   ? Heart failure ([place])   ? HPV (human papilloma virus)  infection   ? Hypercholesteremia   ? Hypertension   ? Ovarian cyst   ? Vaginal Pap smear, abnormal   ? ?Past Surgical History:  ?Past Surgical History:  ?Procedure Laterality Date  ? CHOLECYSTECTOMY    ? SALPINGECTOMY Left   ? Benign growth  ?  ?Social History:  reports that she has never smoked. She has never used smokeless tobacco. She reports that she does not drink alcohol and does not use drugs. ?Family History:  ?Family History  ?Problem Relation Age of Onset  ? Heart disease Mother   ? COPD Mother   ? Diabetes Mother   ? Depression Mother   ? Heart disease Son   ? Asthma Son   ? ? ? ?HOME MEDICATIONS: ?Allergies as of 02/06/2022   ? ?   Reactions  ? Shellfish Allergy Anaphylaxis  ? ?  ? ?  ?Medication List  ?  ? ?  ? Accurate as of February 06, 2022  7:09 AM. If you have any questions, ask your nurse or doctor.  ?  ?  ? ?  ? ?aspirin EC 81 MG tablet ?Take 81 mg by mouth daily. Swallow whole. ?  ?furosemide 20 MG tablet ?Commonly known as: LASIX ?Take 1 tablet (20 mg total) by mouth daily. ?What changed:  ?how much to take ?when to take this ?  ?  hydrALAZINE 25 MG tablet ?Commonly known as: APRESOLINE ?Take 1 tablet (25 mg total) by mouth 2 (two) times daily. ?  ?insulin aspart 100 UNIT/ML injection ?Commonly known as: novoLOG ?Inject into the skin 3 (three) times daily before meals. ?  ?labetalol 200 MG tablet ?Commonly known as: NORMODYNE ?Take 1 tablet (200 mg total) by mouth 2 (two) times daily. ?  ?magnesium 30 MG tablet ?Take 100 mg by mouth 2 (two) times daily. ?  ?omeprazole 40 MG capsule ?Commonly known as: PRILOSEC ?Take 40 mg by mouth daily. ?  ?Omnipod 5 G6 Intro (Gen 5) Kit ?by Does not apply route. ?  ?PRENATAL GUMMIES PO ?Take by mouth. ?  ?progesterone 200 MG Supp ?Place 1 suppository (200 mg total) vaginally at bedtime. ?  ?pyridoxine 200 MG tablet ?Commonly known as: B-6 ?Take 200 mg by mouth daily. ?  ?Vitamin D3 50 MCG (2000 UT) Chew ?Chew by mouth. ?  ? ?  ? ? ? ?ALLERGIES: ?Allergies  ?Allergen  Reactions  ? Shellfish Allergy Anaphylaxis  ? ? ? ?REVIEW OF SYSTEMS: ?A comprehensive ROS was conducted with the patient and is negative except as per HPI  ?  ?OBJECTIVE:  ? ?VITAL SIGNS: LMP 06/04/2021   ? ?PHYSICAL EXAM:  ?General: Pt appears well and is in NAD  ?Neck: General: Supple without adenopathy or carotid bruits. ?Thyroid: Thyroid size normal.  No goiter or nodules appreciated. No thyroid bruit.  ?Lungs: Clear with good BS bilat with no rales, rhonchi, or wheezes  ?Heart: RRR with normal S1 and S2 and no gallops; no murmurs; no rub  ?Abdomen: Normoactive bowel sounds, soft, nontender, without masses or organomegaly palpable  ?Extremities:  ?Lower extremities - No pretibial edema. No lesions.  ?Skin: Normal texture and temperature to palpation. No rash noted. No Acanthosis nigricans/skin tags. No lipohypertrophy.  ?Neuro: MS is good with appropriate affect, pt is alert and Ox3  ? ? ?DM foot exam:  ? ? ?DATA REVIEWED: ? ?Lab Results  ?Component Value Date  ? HGBA1C 7.2 (H) 12/31/2021  ? HGBA1C 9.3 (H) 05/24/2017  ? ? Latest Reference Range & Units 12/31/21 14:07  ?Sodium 134 - 144 mmol/L 134  ?Potassium 3.5 - 5.2 mmol/L 4.9  ?Chloride 96 - 106 mmol/L 99  ?CO2 20 - 29 mmol/L 20  ?Glucose 70 - 99 mg/dL 243 (H)  ?BUN 6 - 20 mg/dL 12  ?Creatinine 0.57 - 1.00 mg/dL 0.56 (L)  ?Calcium 8.7 - 10.2 mg/dL 9.8  ?BUN/Creatinine Ratio 9 - 23  21  ?eGFR >59 mL/min/1.73 120  ?Alkaline Phosphatase 44 - 121 IU/L 49  ?Albumin 3.8 - 4.8 g/dL 4.0  ?Albumin/Globulin Ratio 1.2 - 2.2  1.3  ?AST 0 - 40 IU/L 7  ?ALT 0 - 32 IU/L 10  ?Total Protein 6.0 - 8.5 g/dL 7.0  ?Total Bilirubin 0.0 - 1.2 mg/dL 0.7  ? ? Latest Reference Range & Units 12/31/21 14:07  ?Glucose 70 - 99 mg/dL 243 (H)  ?Hemoglobin A1C 4.8 - 5.6 % 7.2 (H)  ?Est. average glucose Bld gHb Est-mCnc mg/dL 160  ?(H): Data is abnormally high ? ?ASSESSMENT / PLAN / RECOMMENDATIONS:  ? ?1) Type 2 Diabetes Mellitus, Sub-Optimally controlled, With*** complications - Most  recent A1c of 7.2 %. Goal A1c < 7.0 %.   ? ?Plan: ?GENERAL: ?*** ? ?MEDICATIONS: ?*** ? ?EDUCATION / INSTRUCTIONS: ?BG monitoring instructions: Patient is instructed to check her blood sugars *** times a day, ***. ?Call [facility] if: BG persistently <  70 ?I reviewed the Rule of 15 for the treatment of hypoglycemia in detail with the patient. Literature supplied. ? ? ?2) Diabetic complications:  ?Eye: Does *** have known diabetic retinopathy.  ?Neuro/ Feet: Does *** have known diabetic peripheral neuropathy. ?Renal: Patient does *** have known baseline CKD. She is *** on an ACEI/ARB at present.Check urine albumin/creatinine ratio yearly starting at time of diagnosis. If albuminuria is positive, treatment is geared toward better glucose, blood pressure control and use of ACE inhibitors or ARBs. Monitor electrolytes and creatinine once to twice yearly. ? ? ?3) Lipids: Patient is *** on a statin.  ? ? ?4) Hypertension: ***  at goal of < 140/90 mmHg.  ? ? ? ? ? ?Signed electronically by: ?Abby Nena Jordan, MD ? ?[facility]  ?[facility] ?[address] ?[place], [place] [address] ?Phone: [phone] ?FAX: [phone]  ? ?CC: ?[place], Rosalyn Charters, MD ?[address] ?[place] [address] ?Phone: [phone]  ?Fax: [phone] ? ? ? ?Return to Endocrinology clinic as below: ?Future Appointments  ?Date Time Provider [place]  ?02/06/2022 11:50 AM Maame Dack, Melanie Crazier, MD [facility] None  ?02/11/2022  1:30 PM [facility] NURSE [facility]  ?02/11/2022  1:45 PM [facility] US4 [facility] [place]  ?02/12/2022  2:55 PM Donnamae Jude, MD [facility]  ?02/19/2022  3:00 PM [facility] PHARMACIST [facility]  ?02/20/2022  1:45 PM [facility] NURSE [facility]  ?02/20/2022  2:00 PM [facility] US1 [facility]  ?03/19/2022 10:40 AM Tobb, Godfrey Pick, DO [facility]  ?  ? ?

## 2022-02-06 NOTE — Consult Note (Signed)
Neonatology Consult  Note: ? ?At the request of the patients obstetrician Dr. Nelda Marseille I met with [name] who is a 36 y.o. [phone] female at 39w0dpresenting with preeclamptic symptoms.  She has a history of cHTN managed with labetalol, congestive heart failure on Lasix and daily baby aspirin. Also with DM2 on an insulin pump.   ?Her current management includes induction of labor, magnesium sulfate.   ?We reviewed the high likelihood of her infant requiring NICU support given 35 week induction, magnesium exposure and maternal diabetes.  We reviewed initial delivery room management, blood glucose monitoring and possible feeding immaturity. ? ?Thank you for allowing [facility] participate in her care.  Please call with questions. ? ?BHiginio Roger DO  ?Neonatologist ? ?The total length of face-to-face or floor / unit time for this encounter was 45 minutes.  Counseling and / or coordination of care was greater than fifty percent of the time. ? ? ? ?

## 2022-02-06 NOTE — Progress Notes (Signed)
2 RN's attempted to place fetal monitoring pod to better trace FHR. After approximately 15 minutes, attempts were stopped and external [place] was reapplied.  ?

## 2022-02-06 NOTE — Telephone Encounter (Signed)
Great. Thank you so much! Let me know if we need to do anything. Looks like she is currently admitted.  ?

## 2022-02-07 ENCOUNTER — Encounter (HOSPITAL_COMMUNITY): Payer: Self-pay | Admitting: Obstetrics & Gynecology

## 2022-02-07 DIAGNOSIS — O1414 Severe pre-eclampsia complicating childbirth: Secondary | ICD-10-CM

## 2022-02-07 DIAGNOSIS — Z30014 Encounter for initial prescription of intrauterine contraceptive device: Secondary | ICD-10-CM

## 2022-02-07 DIAGNOSIS — O24424 Gestational diabetes mellitus in childbirth, insulin controlled: Secondary | ICD-10-CM

## 2022-02-07 DIAGNOSIS — Z3A35 35 weeks gestation of pregnancy: Secondary | ICD-10-CM

## 2022-02-07 LAB — GLUCOSE, CAPILLARY
Glucose-Capillary: 106 mg/dL — ABNORMAL HIGH (ref 70–99)
Glucose-Capillary: 115 mg/dL — ABNORMAL HIGH (ref 70–99)
Glucose-Capillary: 118 mg/dL — ABNORMAL HIGH (ref 70–99)
Glucose-Capillary: 120 mg/dL — ABNORMAL HIGH (ref 70–99)
Glucose-Capillary: 121 mg/dL — ABNORMAL HIGH (ref 70–99)
Glucose-Capillary: 123 mg/dL — ABNORMAL HIGH (ref 70–99)
Glucose-Capillary: 125 mg/dL — ABNORMAL HIGH (ref 70–99)
Glucose-Capillary: 131 mg/dL — ABNORMAL HIGH (ref 70–99)
Glucose-Capillary: 158 mg/dL — ABNORMAL HIGH (ref 70–99)
Glucose-Capillary: 96 mg/dL (ref 70–99)
Glucose-Capillary: 98 mg/dL (ref 70–99)

## 2022-02-07 LAB — RESP PANEL BY RT-PCR (FLU A&B, COVID) ARPGX2
Influenza A by PCR: NEGATIVE
Influenza B by PCR: NEGATIVE
SARS Coronavirus 2 by RT PCR: NEGATIVE

## 2022-02-07 LAB — CBC
HCT: 35.7 % — ABNORMAL LOW (ref 36.0–46.0)
Hemoglobin: 11.3 g/dL — ABNORMAL LOW (ref 12.0–15.0)
MCH: 26.7 pg (ref 26.0–34.0)
MCHC: 31.7 g/dL (ref 30.0–36.0)
MCV: 84.2 fL (ref 80.0–100.0)
Platelets: 275 10*3/uL (ref 150–400)
RBC: 4.24 MIL/uL (ref 3.87–5.11)
RDW: 16.5 % — ABNORMAL HIGH (ref 11.5–15.5)
WBC: 17 10*3/uL — ABNORMAL HIGH (ref 4.0–10.5)
nRBC: 0 % (ref 0.0–0.2)

## 2022-02-07 MED ORDER — DIPHENHYDRAMINE HCL 25 MG PO CAPS: 25.0000 mg | ORAL_CAPSULE | Freq: Four times a day (QID) | ORAL | Status: DC | PRN

## 2022-02-07 MED ORDER — DIBUCAINE (PERIANAL) 1 % EX OINT: 1.0000 "application " | TOPICAL_OINTMENT | CUTANEOUS | Status: DC | PRN

## 2022-02-07 MED ORDER — LEVONORGESTREL 20.1 MCG/DAY IU IUD
1.0000 | INTRAUTERINE_SYSTEM | Freq: Once | INTRAUTERINE | Status: AC
Start: 2022-02-07 — End: 2022-02-07
  Administered 2022-02-07: 1 via INTRAUTERINE
  Filled 2022-02-07: qty 1

## 2022-02-07 MED ORDER — ONDANSETRON HCL 4 MG/2ML IJ SOLN: 4.0000 mg | INTRAMUSCULAR | Status: DC | PRN

## 2022-02-07 MED ORDER — ACETAMINOPHEN 500 MG PO TABS
1000.0000 mg | ORAL_TABLET | Freq: Once | ORAL | Status: AC
  Administered 2022-02-07: 1000 mg via ORAL
  Filled 2022-02-07: qty 2

## 2022-02-07 MED ORDER — COCONUT OIL OIL: 1.0000 "application " | TOPICAL_OIL | Status: DC | PRN

## 2022-02-07 MED ORDER — SIMETHICONE 80 MG PO CHEW: 80.0000 mg | CHEWABLE_TABLET | ORAL | Status: DC | PRN

## 2022-02-07 MED ORDER — BENZOCAINE-MENTHOL 20-0.5 % EX AERO
1.0000 "application " | INHALATION_SPRAY | CUTANEOUS | Status: DC | PRN
  Administered 2022-02-07: 1 via TOPICAL
  Filled 2022-02-07: qty 56

## 2022-02-07 MED ORDER — OXYCODONE HCL 5 MG PO TABS
5.0000 mg | ORAL_TABLET | Freq: Four times a day (QID) | ORAL | Status: DC | PRN
Start: 2022-02-07 — End: 2022-02-07

## 2022-02-07 MED ORDER — ENOXAPARIN SODIUM 100 MG/ML IJ SOSY
100.0000 mg | PREFILLED_SYRINGE | INTRAMUSCULAR | Status: DC
  Administered 2022-02-08 – 2022-02-09 (×2): 100 mg via SUBCUTANEOUS
  Filled 2022-02-07 (×2): qty 1

## 2022-02-07 MED ORDER — PRENATAL MULTIVITAMIN CH
1.0000 | ORAL_TABLET | Freq: Every day | ORAL | Status: DC
  Administered 2022-02-08: 1 via ORAL
  Filled 2022-02-07: qty 1

## 2022-02-07 MED ORDER — IBUPROFEN 600 MG PO TABS
600.0000 mg | ORAL_TABLET | Freq: Four times a day (QID) | ORAL | Status: DC
  Administered 2022-02-07 – 2022-02-09 (×6): 600 mg via ORAL
  Filled 2022-02-07 (×6): qty 1

## 2022-02-07 MED ORDER — MAGNESIUM SULFATE 40 GM/1000ML IV SOLN
2.0000 g/h | INTRAVENOUS | Status: AC
  Administered 2022-02-07 (×2): 2 g/h via INTRAVENOUS
  Filled 2022-02-07: qty 1000

## 2022-02-07 MED ORDER — TRANEXAMIC ACID-NACL 1000-0.7 MG/100ML-% IV SOLN
INTRAVENOUS | Status: AC
  Filled 2022-02-07: qty 100

## 2022-02-07 MED ORDER — TRANEXAMIC ACID-NACL 1000-0.7 MG/100ML-% IV SOLN
1000.0000 mg | INTRAVENOUS | Status: AC
  Administered 2022-02-07: 1000 mg via INTRAVENOUS

## 2022-02-07 MED ORDER — LACTATED RINGERS IV SOLN
INTRAVENOUS | Status: DC
  Filled 2022-02-07 (×2): qty 1000

## 2022-02-07 MED ORDER — ACETAMINOPHEN 325 MG PO TABS
650.0000 mg | ORAL_TABLET | ORAL | Status: DC | PRN
  Administered 2022-02-07 – 2022-02-08 (×3): 650 mg via ORAL
  Filled 2022-02-07 (×3): qty 2

## 2022-02-07 MED ORDER — WITCH HAZEL-GLYCERIN EX PADS
1.0000 "application " | MEDICATED_PAD | CUTANEOUS | Status: DC | PRN
  Filled 2022-02-07: qty 100

## 2022-02-07 MED ORDER — SENNOSIDES-DOCUSATE SODIUM 8.6-50 MG PO TABS
2.0000 | ORAL_TABLET | Freq: Every day | ORAL | Status: DC
  Filled 2022-02-07: qty 2

## 2022-02-07 MED ORDER — ONDANSETRON HCL 4 MG PO TABS: 4.0000 mg | ORAL_TABLET | ORAL | Status: DC | PRN

## 2022-02-07 NOTE — Progress Notes (Addendum)
Labor Progress Note ?[name] is a 36 y.o. [phone] at [redacted]w[redacted]d presented for IOL d/t pre-e with severe features ?S: Patient is resting comfortably, headache gone currently ? ?O:  ?BP 112/64   Pulse 95   Temp 98.9 ?F (37.2 ?C) (Oral)   Resp 18   Ht 5\' 7"  (1.702 m)   Wt (!) 211.4 kg   LMP 06/04/2021   SpO2 95%   BMI 72.99 kg/m?  ?EFM: baseline 150s/+accels, some early decels ? ?CVE: Dilation: 6 ?Effacement (%): 60, 70 ?Station: -1, -2 ?Presentation: Vertex ?Exam by:: [email or web address], MD ? ? ?A&P: 36 y.o. 30 [redacted]w[redacted]d  ?#Labor: Progressing well. S/p 2 buccal cytotec and FB, AROM. FSE and IUPC in place. Currently on 10 ml/hr pitocin ?#Pain: Epidural in place ?#FWB: Cat I, overall reassuring  ?#GBS positive in urine receiving PCN ? ?#cHTN with superimposed Pre-E with SF ?BP currently 110s systolics recently. Monitoring UOP, foley in place with good output in room. Plan for TXA at delivery. 10 PM preE Labs showed creatinine stable, no transaminitis, magnesium level 3.2- continue at 2g/hr. Headache resolved s/p percocet ?-Continue home labetalol 200 mg BID ?-Added back home hydralazine 25 mg BID ?-Lasix 80 mg BID ?-Continue Mag gtt for seizure PPX ? ?#T2DM ?On endotool last CBG 98, will continue to monitor. No hypoglycemia sxs currently. ? ?#HFpEF ?-Lasix 80 mg BID ? ?#OSA ?On 2L O2 currently, has iWOB when laying flat. At home uses CPAP at setting of 10. Discussed option of calling RT to set up CPAP for patient. At this time patient wants to continue oxygen for now and hold off on CPAP ? ?[redacted]w[redacted]d, MD ?[facility], [facility] ?4:49 AM  ? ?

## 2022-02-07 NOTE — Lactation Note (Signed)
This note was copied from a baby's chart. ?Lactation Consultation Note ? ?Patient Name: [name] ?GHWEX'H Date: 02/07/2022 ?  ?Age:38 hours ? ?Spoke to [facility] RN Marchelle Folks and she confirmed that mom is going to be 100% formula feeding baby. LC services are completed at this time. ? ?Consult Status ?Consult Status: Complete (formula) ? ? ?Jonette Wassel S Curtis Cain ?02/07/2022, 6:26 PM ? ? ? ?

## 2022-02-07 NOTE — Progress Notes (Signed)
CBG 130 patient gets baseline 2.5 insulin in pump each hour. No bolus required. ?

## 2022-02-07 NOTE — Discharge Summary (Signed)
? ?  Postpartum Discharge Summary ?   ?Patient Name: [name] ?DOB: Mar 25, 1986 ?MRN: [id number] ? ?OB Provider: [facility] ? ?Date of admission: 02/06/2022 ?Delivery date:02/07/2022  ?Delivering provider: Radene Gunning  ?Date of discharge: 02/09/2022 ? ?Admitting diagnosis: Pregnancy at 35/0. Severe pre-eclampsia (Severe range BPs and headaches) superimposed on chronic HTN ? ?Secondary diagnosis:  Chronic heart failure. BMI 74. DM2. GBS positive. 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 ?Hospital course: Patient presented to the MAU on 3/3 after calling the clinic stating her BPs were elevated and she did not feel right. She had a persistent headache and some severe range BPs with normal labs so she was admitted for IOL for severe pre-eclampsia. She had an uncomplicated labor course (foley, cytotec, pitocin, AROM),  but did need a low FAVD, with resultant 2nd degree laceration, for FHR indications. A Liletta IUD was placed immediately PP ?Membrane Rupture Time/Date: 11:03 PM ,02/06/2022   ?Delivery Method:Vaginal, Forceps  ?Episiotomy: None  ?Lacerations:  2nd degree  ?Details of delivery can be found in separate delivery note.   ? ?She had an uncomplicated PP course. She received 24 hours of Mg and was transitioned from labetalol PO, which she was on during the pregnancy, to Bystolic; she was kept on her lasix 48m po bid.  Patient is discharged home 02/09/22 and patient discuseed with Dr. THarriet Massonprior to discharge. She was put on ppx lovenox PP and was sent home with two weeks worth given her BMI.  ? ?Newborn Data: ?Birth date:02/07/2022  ?Birth time:11:52 AM  ?Gender:Female  ?Living status:Living  ?Apgars:8  ,8  ?Weight:2670 g  ? ?Magnesium Sulfate received: Yes: Seizure prophylaxis ?BMZ received: No ?Rhophylac: N/A ?MMR: N/A ?T-DaP: Given prenatally ?Flu: No ? ?Physical exam  ?Vitals:  ? 02/08/22 1225 02/08/22 1635 02/09/22 0048 02/09/22 1025  ?BP: (!) 143/79 (!) 147/91 126/73 (!) 143/82  ?Pulse: 95 (!) 101 87 79  ?Resp: _0 ?Temp: 98.2 ?F (36.8 ?C) 98.1 ?F (36.7 ?C) 98.1 ?F (36.7 ?C) 98.6 ?F (37 ?C)  ?TempSrc: Oral Oral Oral Oral  ?SpO2:  96% 100% 100%  ?Weight:      ?Height:      ? ?General: alert ?Lochia: appropriate ?Abdomen: obese, nttp ? ?Labs: ?Lab Results  ?Component Value Date  ? WBC 10.2 02/08/2022  ? HGB 10.2 (L) 02/08/2022  ? HCT 31.6 (L) 02/08/2022  ? MCV 84.9 02/08/2022  ? PLT 241 02/08/2022  ? ?CMP Latest Ref Rng & Units 02/08/2022  ?Glucose 70 - 99 mg/dL 130(H)  ?BUN 6 - 20 mg/dL 8  ?Creatinine 0.44 - 1.00 mg/dL 0.65  ?Sodium 135 - 145 mmol/L 138  ?Potassium 3.5 - 5.1 mmol/L 3.6  ?Chloride 98 - 111 mmol/L 103  ?CO2 22 - 32 mmol/L 26  ?Calcium 8.9 - 10.3 mg/dL 8.1(L)  ?Total Protein 6.5 - 8.1 g/dL 5.5(L)  ?Total Bilirubin 0.3 - 1.2 mg/dL 0.6  ?Alkaline Phos 38 - 126 U/L 48  ?AST 15 - 41 U/L 17  ?ALT 0 - 44 U/L  13  ? ?Edinburgh Score: ?Edinburgh Postnatal Depression Scale Screening Tool 02/08/2022  ?I have been able to laugh and see the funny side of things. 0  ?I have looked forward with enjoyment to things. 0  ?I have blamed myself unnecessarily when things went wrong. 2  ?I have been anxious or worried for no good reason. 2  ?I have felt scared or panicky for no good reason. 1  ?Things have been getting on top of me. 1  ?I have been so unhappy that I have had difficulty sleeping. 2  ?I have felt sad or miserable. 1  ?I have been so unhappy that I have been crying. 1  ?The thought of harming myself has occurred to me. 0  ?Edinburgh Postnatal Depression Scale Total 10  ? ? ? ?After visit meds:  ?Allergies as of 02/09/2022   ? ?   Reactions  ? Shellfish Allergy Anaphylaxis  ? ?  ? ?  ?Medication List   ?  ? ?STOP taking these medications   ? ?aspirin EC 81 MG tablet ?  ?hydrALAZINE 25 MG tablet ?Commonly known as: APRESOLINE ?  ?insulin aspart 100 UNIT/ML injection ?Commonly known as: novoLOG ?  ?labetalol 200 MG tablet ?Commonly known as: NORMODYNE ?  ?progesterone 200 MG Supp ?  ?pyridoxine 200 MG tablet ?Commonly known as: B-6 ?  ? ?  ? ?TAKE these medications   ? ?acetaminophen 325 MG tablet ?Commonly known as: Tylenol ?Take 2 tablets (650 mg total) by mouth every 4 (four) hours as needed (for pain scale < 4). ?  ?enoxaparin 100 MG/ML injection ?Commonly known as: LOVENOX ?Inject 1 mL (100 mg total) into the skin daily for 12 days. ?Start taking on: February 10, 2022 ?  ?enoxaparin Kit ?Commonly known as: LOVENOX ?1 kit by Does not apply route once for 1 dose. ?  ?furosemide 20 MG tablet ?Commonly known as: LASIX ?Take 1 tablet (20 mg total) by mouth daily. ?What changed:  ?how much to take ?when to take this ?  ?ibuprofen 600 MG tablet ?Commonly known as: ADVIL ?Take 1 tablet (600 mg total) by mouth every 6 (six) hours as needed. ?  ?magnesium 30 MG tablet ?Take 100 mg by mouth 2 (two) times daily. ?  ?Nebivolol HCl 20 MG Tabs ?Take 1 tablet (20 mg total) by mouth daily. ?Start taking on: February 10, 2022 ?  ?omeprazole 40 MG capsule ?Commonly known as: PRILOSEC ?Take 40 mg by mouth daily. ?  ?Omnipod 5 G6 Intro (Gen 5) Kit ?by Does not apply route. ?  ?PRENATAL GUMMIES PO ?Take by mouth. ?  ?Vitamin D3 50 MCG (2000 UT) Chew ?Chew by mouth. ?  ? ?  ? ? ? ?Discharge home in stable condition ?Infant Feeding: Bottle ?Infant Disposition: NICU ?Discharge instruction: per After Visit Summary and Postpartum booklet. ?Activity: Advance as tolerated. Pelvic rest for 6 weeks.  ?Diet: Carb modified diet ?Future Appointments: ?Future Appointments  ?Date Time Provider [place]  ?02/16/2022 10:00 AM [facility] NURSE [facility]  ?02/19/2022  3:00 PM [facility] PHARMAC[facility]  ?03/09/2022  2:15 PM Woodroe Mode, MD [facility]  ?03/19/2022 10:40 AM Tobb, Godfrey Pick, DO [facility]  ? ?Follow up Visit: ? Follow-up Information   ? ? [facility] [employer] [facility] Go in 7 day(s).   ?Specialty: Obstetrics and Gynecology ?Why: blood pressure check ?Contact information: ?[address] ?[place] [address] ?[phone] ? ?  ?  ? ?  [facility] [employer] [facility]. Go in 10 day(s).   ?Specialty: Obstetrics and Gynecology ?Why: Cardiology visit with Dr. Harriet Masson ?Contact information: ?[address] ?[place] [address] ?[phone] ? ?  ?  ? ?  ?  ? ?  ? ?Message sent to [facility] by Dr. Gwenlyn Perking on 02/07/22. ? ?Please schedule this patient for a In person postpartum visit in 4 weeks with the following provider: MD. ?Additional Postpartum F/U: BP check 1 week  ?High risk pregnancy complicated by: cHTN with superimposed severe pre-eclampsia, T2DM, CHF ?Delivery mode:  Vaginal, Forceps  ?Anticipated Birth Control:  PP IUD placed ? ?02/09/2022 ?Aletha Halim, MD ? ? ? ?

## 2022-02-07 NOTE — Progress Notes (Signed)
Checked in on patient. She reports having a headache. BP normotensive to mild range. Mg slightly below therapeutic. Tylenol was helpful ~ 12 hrs ago when she had headache. ? ?- Will give 1000mg  Tylenol ?- continue to monitor BP ?- will plan  to check cervix at 4 hours from last check/starting pitocin (~0320) ? ?10-03-2003, MD, MPH ?OB Fellow, Faculty Practice ? ? ?

## 2022-02-07 NOTE — Progress Notes (Signed)
[name] is a 36 y.o. [id number] at [redacted]w[redacted]d by admitted for [facility] with SIPE ? ?Subjective: ?Feeling some pressure now, and she does feel like she can feel the baby moving down. However, she states that she is not ready to push.  ? ?Objective: ?BP (!) 127/59   Pulse 94   Temp 98.1 ?F (36.7 ?C) (Oral)   Resp 16   Ht 5\' 7"  (1.702 m)   Wt (!) 211.4 kg   LMP 06/04/2021   SpO2 98%   BMI 72.99 kg/m?  ?I/O last 3 completed shifts: ?In: 2827.4 [P.O.:225; I.V.:2202.4; IV Piggyback:400] ?Out: 1920 [Urine:1920] ?No intake/output data recorded. ? ?FHT:  FHR: 145 bpm, variability: moderate,  accelerations:  Present,  decelerations:  Absent ?UC:   regular, every 2-3 minutes ?SVE:   Dilation: 10 ?Effacement (%): 60, 70 ?Station: -2 ?Exam by:: [email or web address], RN ? ?Labs: ?Lab Results  ?Component Value Date  ? WBC 12.7 (H) 02/06/2022  ? HGB 12.0 02/06/2022  ? HCT 36.0 02/06/2022  ? MCV 82.9 02/06/2022  ? PLT 311 02/06/2022  ? ? ?Assessment / Plan: ?Induction of labor due to [facility] with SIPE,  progressing well on pitocin ?Patient is fully dilated, and is laboring down at this time. She does not feel ready to push. We will push when she feels ready.  ? ?Labor: Progressing normally ?Preeclampsia:  on magnesium sulfate ?Fetal Wellbeing:  Category I ?Pain Control:  Epidural ?I/D:  n/a ?Anticipated MOD:  NSVD ? ?[facility] DNP, CNM  ?02/07/22  9:22 AM  ? ? ?

## 2022-02-07 NOTE — Progress Notes (Signed)
[name] is a 36 y.o. [phone] at [redacted]w[redacted]d admitted for IOL d/t pre-e with severe features ? ?Subjective: ?Reports feeling constant pressure and needing to push ? ?Objective: ?BP (!) 127/59   Pulse 94   Temp 98.1 ?F (36.7 ?C) (Oral)   Resp 16   Ht 5\' 7"  (1.702 m)   Wt (!) 211.4 kg   LMP 06/04/2021   SpO2 98%   BMI 72.99 kg/m?  ?I/O last 3 completed shifts: ?In: 2827.4 [P.O.:225; I.V.:2202.4; IV Piggyback:400] ?Out: 1920 [Urine:1920] ?No intake/output data recorded. ? ?FHT:  FHR: 150 bpm, variability: moderate,  accelerations:  Present,  decelerations:  Absent ?UC:   regular, every 2-3 minutes ?SVE:   Dilation: 10 ?Effacement (%): 60, 70 ?Station: -2 ?Exam by:: [email or web address], RN ? ?Labs: ?Lab Results  ?Component Value Date  ? WBC 12.7 (H) 02/06/2022  ? HGB 12.0 02/06/2022  ? HCT 36.0 02/06/2022  ? MCV 82.9 02/06/2022  ? PLT 311 02/06/2022  ? ? ?Assessment / Plan: ? 04/08/2022 at [redacted]w[redacted]d admitted for IOL d/t pre-e with severe features ? ?Labor:  Patient complete. Station remains high in pelvis (-2 to -1). Trialed practice pushes with minimal descent and head still high. Recommended laboring down to help with head descent before pushing as head high. Patient agreeable. ?Preeclampsia with SF:  on magnesium sulfate, last labs ~10PM normal with Mg level slightly below therapeutic. Will continue Mg. BP normotensive at this time. ?Fetal Wellbeing:  Category I with periods of Cat II intermittently (was previously Cat II in setting of low BP that improved with Phenylephrine) ?Pain Control:  Epidural ?I/D:   GBS + urine --> PCN ? ? ?#T2DM ?Continue on endotool. ? ?#HFpEF ?Continue lasix 80mg  BID and Labetalol BID. If BP soft can consider holding Hydral. ? ? ? ?[redacted]w[redacted]d ?02/07/2022, 9:05 AM ? ? ?

## 2022-02-08 ENCOUNTER — Encounter (HOSPITAL_COMMUNITY): Payer: Self-pay | Admitting: Obstetrics & Gynecology

## 2022-02-08 LAB — CULTURE, BETA STREP (GROUP B ONLY)

## 2022-02-08 LAB — CBC
HCT: 31.6 % — ABNORMAL LOW (ref 36.0–46.0)
Hemoglobin: 10.2 g/dL — ABNORMAL LOW (ref 12.0–15.0)
MCH: 27.4 pg (ref 26.0–34.0)
MCHC: 32.3 g/dL (ref 30.0–36.0)
MCV: 84.9 fL (ref 80.0–100.0)
Platelets: 241 10*3/uL (ref 150–400)
RBC: 3.72 MIL/uL — ABNORMAL LOW (ref 3.87–5.11)
RDW: 16.8 % — ABNORMAL HIGH (ref 11.5–15.5)
WBC: 10.2 10*3/uL (ref 4.0–10.5)
nRBC: 0 % (ref 0.0–0.2)

## 2022-02-08 LAB — COMPREHENSIVE METABOLIC PANEL
ALT: 13 U/L (ref 0–44)
AST: 17 U/L (ref 15–41)
Albumin: 2.1 g/dL — ABNORMAL LOW (ref 3.5–5.0)
Alkaline Phosphatase: 48 U/L (ref 38–126)
Anion gap: 9 (ref 5–15)
BUN: 8 mg/dL (ref 6–20)
CO2: 26 mmol/L (ref 22–32)
Calcium: 8.1 mg/dL — ABNORMAL LOW (ref 8.9–10.3)
Chloride: 103 mmol/L (ref 98–111)
Creatinine, Ser: 0.65 mg/dL (ref 0.44–1.00)
GFR, Estimated: >60 mL/min (ref >60)
Glucose, Bld: 130 mg/dL — ABNORMAL HIGH (ref 70–99)
Potassium: 3.6 mmol/L (ref 3.5–5.1)
Sodium: 138 mmol/L (ref 135–145)
Total Bilirubin: 0.6 mg/dL (ref 0.3–1.2)
Total Protein: 5.5 g/dL — ABNORMAL LOW (ref 6.5–8.1)

## 2022-02-08 LAB — MAGNESIUM: Magnesium: 3.6 mg/dL — ABNORMAL HIGH (ref 1.7–2.4)

## 2022-02-08 LAB — GLUCOSE, CAPILLARY
Glucose-Capillary: 137 mg/dL — ABNORMAL HIGH (ref 70–99)
Glucose-Capillary: 130 mg/dL — ABNORMAL HIGH (ref 70–99)
Glucose-Capillary: 90 mg/dL (ref 70–99)
Glucose-Capillary: 132 mg/dL — ABNORMAL HIGH (ref 70–99)

## 2022-02-08 MED ORDER — OXYCODONE HCL 5 MG PO TABS
5.0000 mg | ORAL_TABLET | Freq: Four times a day (QID) | ORAL | Status: DC | PRN
  Administered 2022-02-08: 5 mg via ORAL
  Filled 2022-02-08: qty 1

## 2022-02-08 MED ORDER — INSULIN ASPART 100 UNIT/ML IJ SOLN: 0.0000 [IU] | Freq: Every day | INTRAMUSCULAR | Status: DC

## 2022-02-08 MED ORDER — INSULIN ASPART 100 UNIT/ML IJ SOLN: 0.0000 [IU] | Freq: Three times a day (TID) | INTRAMUSCULAR | Status: DC

## 2022-02-08 MED ORDER — INSULIN DETEMIR 100 UNIT/ML ~~LOC~~ SOLN
10.0000 [IU] | Freq: Two times a day (BID) | SUBCUTANEOUS | Status: DC
  Filled 2022-02-08 (×2): qty 0.1

## 2022-02-08 MED ORDER — NEBIVOLOL HCL 10 MG PO TABS
20.0000 mg | ORAL_TABLET | Freq: Every day | ORAL | Status: DC
  Administered 2022-02-08 – 2022-02-09 (×2): 20 mg via ORAL
  Filled 2022-02-08 (×2): qty 2

## 2022-02-08 NOTE — Progress Notes (Signed)
POSTPARTUM PROGRESS NOTE ? ?PPD #1 ? ?Subjective: ? ?[name] is a 36 y.o. [phone] s/p FAVD at [redacted]w[redacted]d. Today she notes some mild to moderate pain, worse with movement.  Notes some improvement with medication. She denies any problems with ambulating, voiding or po intake. Denies nausea or vomiting. She has passed flatus, + BM.   Lochia appropriate ?Denies fever/chills/chest pain/SOB.  No HA, no blurry vision, no RUQ pain ? ?Objective: ?Blood pressure 136/75, pulse 93, temperature 98.2 ?F (36.8 ?C), temperature source Oral, resp. rate 17, height 5\' 7"  (1.702 m), weight (!) 211.4 kg, last menstrual period 06/04/2021, SpO2 95 %, unknown if currently breastfeeding. ? ?Physical Exam:  ?General: alert, cooperative and no distress ?Chest: no respiratory distress ?Heart: regular rate and rhythm ?Abdomen: obese, soft, nontender ?Uterine Fundus:  difficult to asssess due to body habitus ?Incision: n/a ?DVT Evaluation: No calf swelling or tenderness ?Extremities: 1+ edema ?Skin: warm, dry ? ?Results for orders placed or performed during the hospital encounter of 02/06/22 (from the past 24 hour(s))  ?Glucose, capillary     Status: Abnormal  ? Collection Time: 02/07/22 10:30 AM  ?Result Value Ref Range  ? Glucose-Capillary 118 (H) 70 - 99 mg/dL  ?Glucose, capillary     Status: Abnormal  ? Collection Time: 02/07/22 12:26 PM  ?Result Value Ref Range  ? Glucose-Capillary 123 (H) 70 - 99 mg/dL  ?CBC     Status: Abnormal  ? Collection Time: 02/07/22  1:22 PM  ?Result Value Ref Range  ? WBC 17.0 (H) 4.0 - 10.5 K/uL  ? RBC 4.24 3.87 - 5.11 MIL/uL  ? Hemoglobin 11.3 (L) 12.0 - 15.0 g/dL  ? HCT 35.7 (L) 36.0 - 46.0 %  ? MCV 84.2 80.0 - 100.0 fL  ? MCH 26.7 26.0 - 34.0 pg  ? MCHC 31.7 30.0 - 36.0 g/dL  ? RDW 16.5 (H) 11.5 - 15.5 %  ? Platelets 275 150 - 400 K/uL  ? nRBC 0.0 0.0 - 0.2 %  ?Glucose, capillary     Status: Abnormal  ? Collection Time: 02/07/22 10:42 PM  ?Result Value Ref Range  ? Glucose-Capillary 158 (H) 70 - 99  mg/dL  ?Glucose, capillary     Status: Abnormal  ? Collection Time: 02/08/22  4:53 AM  ?Result Value Ref Range  ? Glucose-Capillary 137 (H) 70 - 99 mg/dL  ?Comprehensive metabolic panel     Status: Abnormal  ? Collection Time: 02/08/22  5:31 AM  ?Result Value Ref Range  ? Sodium 138 135 - 145 mmol/L  ? Potassium 3.6 3.5 - 5.1 mmol/L  ? Chloride 103 98 - 111 mmol/L  ? CO2 26 22 - 32 mmol/L  ? Glucose, Bld 130 (H) 70 - 99 mg/dL  ? BUN 8 6 - 20 mg/dL  ? Creatinine, Ser 0.65 0.44 - 1.00 mg/dL  ? Calcium 8.1 (L) 8.9 - 10.3 mg/dL  ? Total Protein 5.5 (L) 6.5 - 8.1 g/dL  ? Albumin 2.1 (L) 3.5 - 5.0 g/dL  ? AST 17 15 - 41 U/L  ? ALT 13 0 - 44 U/L  ? Alkaline Phosphatase 48 38 - 126 U/L  ? Total Bilirubin 0.6 0.3 - 1.2 mg/dL  ? GFR, Estimated >60 >60 mL/min  ? Anion gap 9 5 - 15  ?Magnesium     Status: Abnormal  ? Collection Time: 02/08/22  5:31 AM  ?Result Value Ref Range  ? Magnesium 3.6 (H) 1.7 - 2.4 mg/dL  ?CBC  Status: Abnormal  ? Collection Time: 02/08/22  5:31 AM  ?Result Value Ref Range  ? WBC 10.2 4.0 - 10.5 K/uL  ? RBC 3.72 (L) 3.87 - 5.11 MIL/uL  ? Hemoglobin 10.2 (L) 12.0 - 15.0 g/dL  ? HCT 31.6 (L) 36.0 - 46.0 %  ? MCV 84.9 80.0 - 100.0 fL  ? MCH 27.4 26.0 - 34.0 pg  ? MCHC 32.3 30.0 - 36.0 g/dL  ? RDW 16.8 (H) 11.5 - 15.5 %  ? Platelets 241 150 - 400 K/uL  ? nRBC 0.0 0.0 - 0.2 %  ?Glucose, capillary     Status: Abnormal  ? Collection Time: 02/08/22  7:52 AM  ?Result Value Ref Range  ? Glucose-Capillary 130 (H) 70 - 99 mg/dL  ? ? ?Assessment/Plan: ?[name] is a 36 y.o. [phone] s/p FAVD at [redacted]w[redacted]d PPD#1 complicated by: ?1) cHTN with superimposed preeclampsia, CHF ?-currently on Mag, discontinued around 12 today ?-on Lasix 80mg  bid ?-BP appropriate, pt asymptomatic, labs stable ? ?2) T2DM ?-on insulin pump, sugars elevated, likely plan to increase basal rate- will review with DM coordinator ?-plan to follow with [facility]o outpatient ? ?3) CHF ?-stable ?-plan to follow with Dr.  outpatient ? ?4) Postpartum care ?-adding oxycodone for pain management ?--Foley in place, plan to discontinue once Mag off ?-ambulating well ?-Lovenox for DVT prophylaxis ? ?Contraception: IUD in place ?Feeding: bottle, baby in NICU ? ?Dispo: Continue with care as outlined above, plan for possible discharge tomorrow ? ? LOS: 2 days  ? ?Servando Salina, DO ?Faculty Attending, [facility] ?02/08/2022, 10:19 AM  ?

## 2022-02-08 NOTE — Progress Notes (Signed)
CBG 158 on 3/4 at 2153. Patient receiving 2.5 insulin via pump per hour. No bolus required.  ? ?Harl Bowie, RN ?02/07/22 2153 ?

## 2022-02-08 NOTE — Progress Notes (Signed)
Inpatient Diabetes Program Recommendations ? ?AACE/ADA: New Consensus Statement on Inpatient Glycemic Control (2015) ? ?Target Ranges:  Prepandial:   less than 140 mg/dL ?     Peak postprandial:   less than 180 mg/dL (1-2 hours) ?     Critically ill patients:  140 - 180 mg/dL  ? ?Lab Results  ?Component Value Date  ? GLUCAP 90 02/08/2022  ? HGBA1C 7.2 (H) 12/31/2021  ? ? ?Review of Glycemic Control ? ? ?Current orders for Inpatient glycemic control:  ? ?Levemir 10 units BID and Novolog 0-15 TID with meals and 0-5 HS ?D/C insulin pump until pt has seen Endo. ?Pt is not breast-feeding. ? ? ?Inpatient Diabetes Program Recommendations:   ? ?F/U with Endo ASAP regarding insulin pump ? ?Levemir 10 BID ?Novolog 0-15 TID with meals and 0-5 HS ?Monitor blood sugars 3-4x/day ? ?Spoke with MD regarding above. ? ?Thank you. ?Ailene Ards, RD, LDN, CDE ?Inpatient Diabetes Coordinator ?[phone]  ? ? ? ? ? ? ?

## 2022-02-08 NOTE — Progress Notes (Signed)
Patient states she had to bolus 11.9 insulin from pump for dinner. ?

## 2022-02-08 NOTE — Progress Notes (Addendum)
Circumcision Consent ° °Discussed with mom at bedside about circumcision.  ° °Circumcision is a surgery that removes the skin that covers the tip of the penis, called the "foreskin." Circumcision is usually done when a boy is between 1 and 10 days old, sometimes up to 3-4 weeks old. ° °The most common reasons boys are circumcised include for cultural/religious beliefs or for parental preference (potentially easier to clean, so baby looks like daddy, etc). ° °There may be some medical benefits for circumcision:  ° °Circumcised boys seem to have slightly lower rates of: °? Urinary tract infections (per the American Academy of Pediatrics an uncircumcised boy has a 1/100 chance of developing a UTI in the first year of life, a circumcised boy at a 12/998 chance of developing a UTI in the first year of life- a 10% reduction) °? Penis cancer (typically rare- an uncircumcised female has a 1 in 100,000 chance of developing cancer of the penis) °? Sexually transmitted infection (in endemic areas, including HIV, HPV and Herpes- circumcision does NOT protect against gonorrhea, chlamydia, trachomatis, or syphilis) °? Phimosis: a condition where that makes retraction of the foreskin over the glans impossible (0.4 per 1000 boys per year or 0.6% of boys are affected by their 15th birthday) ° °Boys and men who are not circumcised can reduce these extra risks by: °? Cleaning their penis well °? Using condoms during sex ° °What are the risks of circumcision? ° °As with any surgical procedure, there are risks and complications. In circumcision, complications are rare and usually minor, the most common being: °? Bleeding- risk is reduced by holding each clamp for 30 seconds prior to a cut being made, and by holding pressure after the procedure is done °? Infection- the penis is cleaned prior to the procedure, and the procedure is done under sterile technique °? Damage to the urethra or amputation of the penis ° °How is circumcision done  in baby boys? ° °The baby will be placed on a special table and the legs restrained for their safety. Numbing medication is injected into the penis, and the skin is cleansed with betadine to decrease the risk of infection.  ° °What to expect: ° °The penis will look red and raw for 5-7 days as it heals. We expect scabbing around where the cut was made, as well as clear-pink fluid and some swelling of the penis right after the procedure. °If your baby's circumcision starts to bleed or develops pus, please contact your pediatrician immediately. ° °All questions were answered and mother consented. ° °[name] [name] °  °

## 2022-02-08 NOTE — Progress Notes (Signed)
Patient refused insulin change and wishes to stay on her home insulin pump at current parameters. Patient was educated on diabetes control and reason for change. Patient declined change in diabetic management while inpatient. ?

## 2022-02-08 NOTE — Progress Notes (Signed)
Patient states she would like to be discharged this evening if possible. Fasting CBG 130, patient states she gave herself a bolus of insulin via pump of 10 units per pump values prior to a breakfast of 37 carbs. LUE slightly edematous from IV infiltration overnight, patient states no pain in limb. Mechanical VTE not present due to patient request stating she has neuropathy and touch is painful in lower extremities. Patient states no pain in abdomen and fundus is not palpable to RN. Scant bleeding from vagina. Patient ambulates. ?

## 2022-02-08 NOTE — Anesthesia Postprocedure Evaluation (Signed)
Anesthesia Post Note ? ?Patient: [name] ? ?Procedure(s) Performed: AN AD HOC LABOR EPIDURAL ? ?  ? ?Patient location during evaluation: Mother Baby ?Anesthesia Type: Epidural ?Level of consciousness: awake and alert ?Pain management: pain level controlled ?Vital Signs Assessment: post-procedure vital signs reviewed and stable ?Respiratory status: spontaneous breathing, nonlabored ventilation and respiratory function stable ?Cardiovascular status: stable ?Postop Assessment: no headache, no backache, epidural receding, no apparent nausea or vomiting, patient able to bend at knees, adequate PO intake and able to ambulate ?Anesthetic complications: no ? ? ?No notable events documented. ? ?Last Vitals:  ?Vitals:  ? 02/08/22 0755 02/08/22 0900  ?BP: 136/75   ?Pulse: 93   ?Resp: 17 17  ?Temp: 36.8 ?C   ?SpO2: 95%   ?  ?Last Pain:  ?Vitals:  ? 02/08/22 0755  ?TempSrc: Oral  ?PainSc:   ? ?Pain Goal:   ? ?  ?  ?  ?  ?  ?  ?  ? ?[name] ? ? ? ? ?

## 2022-02-09 ENCOUNTER — Other Ambulatory Visit (HOSPITAL_COMMUNITY): Payer: Self-pay

## 2022-02-09 DIAGNOSIS — Z975 Presence of (intrauterine) contraceptive device: Secondary | ICD-10-CM

## 2022-02-09 HISTORY — DX: Presence of (intrauterine) contraceptive device: Z97.5

## 2022-02-09 LAB — GLUCOSE, CAPILLARY
Glucose-Capillary: 105 mg/dL — ABNORMAL HIGH (ref 70–99)
Glucose-Capillary: 95 mg/dL (ref 70–99)

## 2022-02-09 MED ORDER — ACETAMINOPHEN 325 MG PO TABS
650.0000 mg | ORAL_TABLET | ORAL | 0 refills | Status: AC | PRN
  Filled 2022-02-09: qty 30, 3d supply, fill #0

## 2022-02-09 MED ORDER — IBUPROFEN 600 MG PO TABS
600.0000 mg | ORAL_TABLET | Freq: Four times a day (QID) | ORAL | 0 refills | Status: DC | PRN
  Filled 2022-02-09: qty 30, 8d supply, fill #0

## 2022-02-09 MED ORDER — ENOXAPARIN (LOVENOX) PATIENT EDUCATION KIT
PACK | Freq: Once | Status: DC
  Filled 2022-02-09: qty 1

## 2022-02-09 MED ORDER — ENOXAPARIN (LOVENOX) PATIENT EDUCATION KIT
1.0000 | PACK | Freq: Once | 0 refills | Status: AC
  Filled 2022-02-09: qty 1, 1d supply, fill #0

## 2022-02-09 MED ORDER — NEBIVOLOL HCL 20 MG PO TABS
20.0000 mg | ORAL_TABLET | Freq: Every day | ORAL | 2 refills | Status: AC
  Filled 2022-02-09: qty 30, 30d supply, fill #0

## 2022-02-09 MED ORDER — ENOXAPARIN SODIUM 100 MG/ML IJ SOSY
100.0000 mg | PREFILLED_SYRINGE | INTRAMUSCULAR | 0 refills | Status: DC
  Filled 2022-02-09: qty 12, 12d supply, fill #0

## 2022-02-09 NOTE — Progress Notes (Incomplete)
1030-patient instructed on how to administer Lovenox injections: how to clean the area, the SQ area of the right and left of the abdomen, and the proper technique. Lovenox home kit given to patient and reviewed with patient the contents. Patient very emotional with teaching and unsure of her ability to self-administer. Discussed similarities of insulin injections. Patient tearful, and eventually   ?

## 2022-02-09 NOTE — Clinical Social Work Maternal (Signed)
?CLINICAL SOCIAL WORK MATERNAL/CHILD NOTE ? ?Patient Details  ?Name: [name] ?MRN: [id number] ?Date of Birth: 08/01/1986 ? ?Date:  02/09/2022 ? ?Clinical Social Worker Initiating Note:  Samiel Peel, LCSW Date/Time: Initiated:  02/09/22/1408    ? ?Child's Name:  [name]  ? ?Biological Parents:  Mother, Father (Father: [name])  ? ?Need for Interpreter:  None  ? ?Reason for Referral:  Parental Support of Premature Babies < 32 weeks/or Critically Ill babies, Behavioral Health Concerns  ? ?Address:  [address] ?[place] [address]  ?  ?Phone number:  [phone] (home)    ? ?Additional phone number:  ? ?Household Members/Support Persons (HM/SP):   Household Member/Support Person 1 ? ? ?HM/SP Name Relationship DOB or Age  ?HM/SP -1 [name] daughter 06/30/05  ?HM/SP -2        ?HM/SP -3        ?HM/SP -4        ?HM/SP -5        ?HM/SP -6        ?HM/SP -7        ?HM/SP -8        ? ? ?Natural Supports (not living in the home):  Friends, Spouse/significant other  ? ?Professional Supports: None  ? ?Employment: Unemployed  ? ?Type of Work:    ? ?Education:  Some College  ? ?Homebound arranged:   ? ?Financial Resources:  Medicaid  ? ?Other Resources:  WIC, Food Stamps    ? ?Cultural/Religious Considerations Which May Impact Care:   ? ?Strengths:  Ability to meet basic needs    ? ?Psychotropic Medications:        ? ?Pediatrician:      ? ?Pediatrician List:  ? ?[place]    ?[place]    ?[place]    ?[place]    ?[place]    ?[place]    ? ? ?Pediatrician Fax Number:   ? ?Risk Factors/Current Problems:  Mental Health Concerns    ? ?Cognitive State:  Able to Concentrate  , Alert  , Goal Oriented  , Linear Thinking  , Insightful    ? ?Mood/Affect:  Calm  , Interested  , Comfortable  , Relaxed    ? ?CSW Assessment: CSW met with [name] at infant's bedside to complete psychosocial assessment. [name] was sitting in recliner and appeared to be asleep while  holding infant. [name] woke up after CSW called [name] name. CSW acknowledged that [name] was holding infant while asleep and provided SIDS education, [name] was receptive to education provided. [name] shared that she had only had 2 hours of sleep, CSW encouraged [name] to lay infant down when feeling sleepy. [name] shared that FOB was in route and would be helping [name]. CSW introduced self and explained role. [name] was welcoming, open, pleasant, and remained engaged during assessment. [name] reported that she resides with her daughter and receives both WIC and food stamps. [name] reported that they have all items needed to care for infant including a car seat and crib. CSW inquired about [name] support system, [name] reported that FOB, her daughter,and best friends are supports. [name] reported that her oldest son who is now 18 lives with her mom and shared that she completed an adoption in 2008.  ? ?CSW inquired about [name] mental health history. [name] reported that she was diagnosed with Major Depressive Disorder at age 3, PTSD at age 14, and Panic Anxiety Disorder at age 14. [name] reported that   prior to pregnancy she was taking Trintellix, Lamictal, and Klonopin; noting they were helpful. [name] reported that her medications are managed by her PCP at [facility] and she plans to restart her medication. [name] shared that she has an upcoming appointment this month to restart her medication. [name] denied any current symptoms of her mental health diagnoses. CSW inquired about how [name] was feeling while off her medication during pregnancy, [name] reported that she had highs and lows, and at times she was really emotional. CSW asked if [name] was interested in therapy resources, [name] shared that she is communication with [facility] to identify a therapist. CSW inquired about how [name] was feeling emotionally since giving birth, [name] reported that she was feeling worried with this being her first NICU baby. CSW acknowledged and validated [name] feelings  surrounding infant's NICU admission. [name] reported that she is okay, tired and happy that infant is here. [name] presented calm and did not demonstrate any acute mental health signs/symptoms. CSW assessed for safety, [name] denied SI, HI, and domestic violence.  ? ?CSW provided education regarding the baby blues period vs. perinatal mood disorders, discussed treatment and gave resources for mental health follow up if concerns arise.  CSW recommends self-evaluation during the postpartum time period using the New Mom Checklist from Postpartum Progress and encouraged [name] to contact a medical professional if symptoms are noted at any time.   ? ?CSW provided review of Sudden Infant Death Syndrome (SIDS) precautions.  ? ?CSW and [name] discussed infant's NICU admission. CSW informed [name] about the NICU, what to expect, and resources/supports available while infant is admitted to the NICU. [name] reported that she feels well informed about infant's care. [name] denied any transportation barriers with visiting infant in the NICU. [name] shared about previous issues with transportation and reported that they plan to look for a new car soon as they've settled with their insurance company. [name] shared that she plans to stay overnight with infant. [name] denied any questions/concerns regarding the NICU.  ?  ?CSW will continue to offer resources/supports while infant is admitted to the NICU.  ? ? ?CSW Plan/Description:  Psychosocial Support and Ongoing Assessment of Needs, Sudden Infant Death Syndrome (SIDS) Education, Perinatal Mood and Anxiety Disorder (PMADs) Education, Other Patient/Family Education  ? ? ?Nayelie Gionfriddo L Eathon Valade, LCSW ?02/09/2022, 2:10 PM ?

## 2022-02-09 NOTE — Discharge Instructions (Signed)
Continue to check your blood pressures twice a day Call the office for blood pressures that are consistently above 150 for the top number or 100 for the bottom number   Hypertension During Pregnancy Hypertension is also called high blood pressure. High blood pressure means that the force of your blood moving in your body is too strong. It can cause problems for you and your baby. Different types of high blood pressure can happen during pregnancy. The types are: High blood pressure before you got pregnant. This is called chronic hypertension.  This can continue during your pregnancy. Your doctor will want to keep checking your blood pressure. You may need medicine to keep your blood pressure under control while you are pregnant. You will need follow-up visits after you have your baby. High blood pressure that goes up during pregnancy when it was normal before. This is called gestational hypertension. It will usually get better after you have your baby, but your doctor will need to watch your blood pressure to make sure that it is getting better. Very high blood pressure during pregnancy. This is called preeclampsia. Very high blood pressure is an emergency that needs to be checked and treated right away. You may develop very high blood pressure after giving birth. This is called postpartum preeclampsia. This usually occurs within 48 hours after childbirth but may occur up to 6 weeks after giving birth. This is rare. How does this affect me? If you have high blood pressure during pregnancy, you have a higher chance of developing high blood pressure: As you get older. If you get pregnant again. In some cases, high blood pressure during pregnancy can cause: Stroke. Heart attack. Damage to the kidneys, lungs, or liver. Preeclampsia. Jerky movements you cannot control (convulsions or seizures). Problems with the placenta.  What can I do to lower my risk?  Keep a healthy weight. Eat a healthy  diet. Follow what your doctor tells you about treating any medical problems that you had before becoming pregnant. It is very important to go to all of your doctor visits. Your doctor will check your blood pressure and make sure that your pregnancy is progressing as it should. Treatment should start early if a problem is found.  Follow these instructions at home:  Take your blood pressure 1-2 times per day. Call the office if your blood pressure is 155 or higher for the top number or 105 or higher for the bottom number.    Eating and drinking  Drink enough fluid to keep your pee (urine) pale yellow. Avoid caffeine. Lifestyle Do not use any products that contain nicotine or tobacco, such as cigarettes, e-cigarettes, and chewing tobacco. If you need help quitting, ask your doctor. Do not use alcohol or drugs. Avoid stress. Rest and get plenty of sleep. Regular exercise can help. Ask your doctor what kinds of exercise are best for you. General instructions Take over-the-counter and prescription medicines only as told by your doctor. Keep all prenatal and follow-up visits as told by your doctor. This is important. Contact a doctor if: You have symptoms that your doctor told you to watch for, such as: Headaches. Nausea. Vomiting. Belly (abdominal) pain. Dizziness. Light-headedness. Get help right away if: You have: Very bad belly pain that does not get better with treatment. A very bad headache that does not get better. Vomiting that does not get better. Sudden, fast weight gain. Sudden swelling in your hands, ankles, or face. Blood in your pee. Blurry vision. Double vision.   Shortness of breath. Chest pain. Weakness on one side of your body. Trouble talking. Summary High blood pressure is also called hypertension. High blood pressure means that the force of your blood moving in your body is too strong. High blood pressure can cause problems for you and your baby. Keep all  follow-up visits as told by your doctor. This is important. This information is not intended to replace advice given to you by your health care provider. Make sure you discuss any questions you have with your health care provider. Document Released: 12/26/2010 Document Revised: 03/16/2019 Document Reviewed: 12/20/2018 Elsevier Patient Education  2020 Elsevier Inc.  

## 2022-02-10 ENCOUNTER — Telehealth: Payer: Self-pay

## 2022-02-10 NOTE — Telephone Encounter (Signed)
Called pt to get her on the schedule to see Dr. Tobb or Chris. Pt states "well he has been in the NICU and I have been staying with him. From what I can tell everything has been fine. The fluid has been going through fine, I have been taking the Lasix like I'm supposed to. I've been taking the medication for the blood clots. I wanted to see if we could push that appt out." Told pt I will get the message over to Dr. Tobb, she verbalized understanding. No further questions at this time.  ?

## 2022-02-10 NOTE — Telephone Encounter (Signed)
Called pt to get her on the schedule to see Dr. Servando Salina or Thayer Ohm. Pt states "well he has been in the NICU and I have been staying with him. From what I can tell everything has been fine. The fluid has been going through fine, I have been taking the Lasix like I'm supposed to. I've been taking the medication for the blood clots. I wanted to see if we could push that appt out." Told pt I will get the message over to Dr. Servando Salina, she verbalized understanding. No further questions at this time.  ?

## 2022-02-11 ENCOUNTER — Encounter (HOSPITAL_COMMUNITY): Payer: Self-pay | Admitting: Obstetrics & Gynecology

## 2022-02-11 ENCOUNTER — Other Ambulatory Visit: Payer: Self-pay

## 2022-02-11 ENCOUNTER — Inpatient Hospital Stay (HOSPITAL_COMMUNITY)
Admission: AD | Admit: 2022-02-11 | Discharge: 2022-02-11 | Disposition: A | Discharge status: Home / Self Care | Payer: Medicaid Other | Attending: Obstetrics & Gynecology | Admitting: Obstetrics & Gynecology

## 2022-02-11 ENCOUNTER — Ambulatory Visit: Payer: Medicaid Other

## 2022-02-11 DIAGNOSIS — M7989 Other specified soft tissue disorders: Secondary | ICD-10-CM | POA: Diagnosis not present

## 2022-02-11 DIAGNOSIS — O1205 Gestational edema, complicating the puerperium: Secondary | ICD-10-CM | POA: Insufficient documentation

## 2022-02-11 NOTE — MAU Note (Signed)
PT SAYS SHE DEL VAG ON Sunday 02-07-2022 ?BABY IN NICU  ?LEGS  AND FEET HURT TO WALK ? ?

## 2022-02-11 NOTE — Discharge Instructions (Signed)
Use compression hose and continue to take your medications as prescribed.  ? ?If you symptoms do not continue to improve over the next 5-7 days or if you develop chest pain/shortness of breath - return for further evaluation.  ?

## 2022-02-11 NOTE — MAU Provider Note (Signed)
?History  ? ?[id number] ? ?Arrival date and time: 02/11/22 0522 ?  ?Chief Complaint  ?Patient presents with  ? Leg Swelling  ? ?HPI ?[name] is a 36 y.o. [id number] s/p vaginal delivery on 3/4 who presents to MAU with complaint of swelling in her legs and feet.  ? ?Patient states that she has had swelling in her feet that has not gone away since delivery. The swelling starts from the middle of her shins and goes down into her feet. It has made it hard to get shoes on. She also states that the swelling has made her feet hurt and it makes it harder for her to walk. She denies any overlying redness or warmth over her legs/feet. She denies calf tenderness. She denies fevers, headache, blurred vision, RUQ pain, chest pain or SOB. She has been compliant with her Lasix, Lovenox, and Bystolic as prescribed.  ? ?She reports being under some stress since her infant has been in the NICU. She is hoping to get back up there once she makes sure everything is okay here. She denies any other issues postpartum and denies abdominal pain or heavy bleeding.  ? ?She has no other complaints at this time.  ? ?--/--/O POS (03/03 1130) ? ?OB History   ? ? Gravida  ?5  ? Para  ?4  ? Term  ?2  ? Preterm  ?2  ? AB  ?1  ? Living  ?4  ?  ? ? SAB  ?1  ? IAB  ?   ? Ectopic  ?   ? Multiple  ?0  ? Live Births  ?4  ?   ?  ?  ? ? ?Past Medical History:  ?Diagnosis Date  ? Anxiety   ? Bipolar disorder ([place])   ? CHF (congestive heart failure) ([place])   ? Depression   ? Diabetes mellitus without complication ([place])   ? GERD (gastroesophageal reflux disease)   ? Heart failure ([place])   ? HPV (human papilloma virus) infection   ? Hypercholesteremia   ? Hypertension   ? Ovarian cyst   ? Vaginal Pap smear, abnormal   ? ? ?Past Surgical History:  ?Procedure Laterality Date  ? CHOLECYSTECTOMY    ? SALPINGECTOMY Left   ? Benign growth  ? ? ?Family History  ?Problem Relation Age of Onset  ? Heart disease Mother   ? COPD Mother   ? Diabetes Mother   ?  Depression Mother   ? Heart disease Son   ? Asthma Son   ? ? ?Social History  ? ?Socioeconomic History  ? Marital status: Single  ?  Spouse name: Not on file  ? Number of children: Not on file  ? Years of education: Not on file  ? Highest education level: Not on file  ?Occupational History  ? Not on file  ?Tobacco Use  ? Smoking status: Never  ? Smokeless tobacco: Never  ?Vaping Use  ? Vaping Use: Never used  ?Substance and Sexual Activity  ? Alcohol use: No  ? Drug use: No  ? Sexual activity: Never  ?Other Topics Concern  ? Not on file  ?Social History Narrative  ? Not on file  ? ?Social Determinants of Health  ? ?Financial Resource Strain: Not on file  ?Food Insecurity: No Food Insecurity  ? Worried About Charity fundraiser in the Last Year: Never true  ? Ran Out of Food in the Last Year: Never true  ?Transportation Needs: Scientific laboratory technician  Needs  ? Lack of Transportation (Medical): Yes  ? Lack of Transportation (Non-Medical): No  ?Physical Activity: Not on file  ?Stress: Not on file  ?Social Connections: Not on file  ?Intimate Partner Violence: Not on file  ? ? ?Allergies  ?Allergen Reactions  ? Fish Allergy Anaphylaxis  ? Shellfish Allergy Anaphylaxis  ? ? ?No current facility-administered medications on file prior to encounter.  ? ?Current Outpatient Medications on File Prior to Encounter  ?Medication Sig Dispense Refill  ? acetaminophen (TYLENOL) 325 MG tablet Take 2 tablets (650 mg total) by mouth every 4 (four) hours as needed (for pain scale < 4). 30 tablet 0  ? enoxaparin (LOVENOX) 100 MG/ML injection Inject 1 syringe (100 mg total) into the skin daily for 12 days. 12 mL 0  ? furosemide (LASIX) 20 MG tablet Take 1 tablet (20 mg total) by mouth daily. (Patient taking differently: Take 80 mg by mouth 2 (two) times daily.) 90 tablet 3  ? ibuprofen (ADVIL) 600 MG tablet Take 1 tablet (600 mg total) by mouth every 6 (six) hours as needed. 30 tablet 0  ? Insulin Disposable Pump (OMNIPOD 5 G6 INTRO, GEN 5,)  KIT by Does not apply route. Use with Novolog    ? Magnesium 200 MG TABS Take 200 mg by mouth 2 (two) times daily.    ? Nebivolol HCl 20 MG TABS Take 1 tablet (20 mg total) by mouth daily. 30 tablet 2  ? omeprazole (PRILOSEC) 40 MG capsule Take 40 mg by mouth every morning.    ? Prenatal MV & Min w/FA-DHA (PRENATAL GUMMIES PO) Take 1 tablet by mouth every morning.    ? Cholecalciferol (VITAMIN D3) 50 MCG (2000 UT) CHEW Chew 2,000 Units by mouth every morning.    ? ? ? ?ROS ?Pertinent positives and negative per HPI, all others reviewed and negative. ? ?Physical Exam  ? ?Vitals:  ? 02/11/22 0540 02/11/22 0354  ?BP: (!) 160/82 (!) 148/67  ?Pulse: 89 88  ?Resp: 20   ?Temp: 98 ?F (36.7 ?C)   ? ? ?Physical Exam ?Constitutional:   ?   General: She is not in acute distress. ?   Appearance: She is not toxic-appearing.  ?HENT:  ?   Head: Normocephalic and atraumatic.  ?   Mouth/Throat:  ?   Mouth: Mucous membranes are moist.  ?Eyes:  ?   Extraocular Movements: Extraocular movements intact.  ?   Conjunctiva/sclera: Conjunctivae normal.  ?Cardiovascular:  ?   Rate and Rhythm: Normal rate and regular rhythm.  ?   Heart sounds: No murmur heard. ?Pulmonary:  ?   Effort: Pulmonary effort is normal.  ?   Breath sounds: Normal breath sounds. No wheezing or rhonchi.  ?Abdominal:  ?   Palpations: Abdomen is soft.  ?   Tenderness: There is no abdominal tenderness.  ?Musculoskeletal:     ?   General: Normal range of motion.  ?   Comments: Symmetric nonpitting edema to lower extremities bilaterally up to mid-shin. No overlying erythema or warmth. Mildly tender to palpation over dorsal aspect of feet in areas of edema, no calf tenderness to palpation. Dorsalis pedis pulses intact bilaterally.   ?Skin: ?   General: Skin is warm and dry.  ?   Capillary Refill: Capillary refill takes less than 2 seconds.  ?   Findings: No lesion or rash.  ?Neurological:  ?   General: No focal deficit present.  ?   Mental Status: She is alert and oriented to  person, place, and time.  ?Psychiatric:     ?   Mood and Affect: Mood normal.     ?   Behavior: Behavior normal.  ? ?Labs ?No results found for this or any previous visit (from the past 24 hour(s)). ? ?Imaging ?No results found. ? ?MAU Course  ?Procedures ?Lab Orders  ?No laboratory test(s) ordered today  ? ?No orders of the defined types were placed in this encounter. ? ?Imaging Orders  ?No imaging studies ordered today  ? ?MDM ?Patient presents to MAU with leg swelling in her lower legs and feet postpartum ?S/p forceps assisted vaginal delivery on 3/4 ?Pregnancy complicated by severe pre-eclampsia, chronic hypertension, HFpEF, and T2DM  ?Compliant with home Lasix, Bystolic, Lovenox postpartum as prescribed  ?Upon arrival, HDS, no acute distress  ?Patient initially upset and crying due to worry about her baby in NICU ?BP elevated at this time; improved on repeat and near baseline ?Cardiopulmonary exam within normal limits ?Nonpitting edema present in bilateral lower extremities, primarily in her feet up to mid-shins ?No signs of overlying cellulitis  ?No pain/calf tenderness to suggest DVT ?Exacerbation most consistent with postpartum state ? ?Assessment and Plan  ? ?1. Leg swelling ?- Stable for discharge ?- Provided reassurance that symptoms should continue to improve over the next 5-7 days ?- Recommended compression hose to help improve dependent swelling in addition to leg elevation when able while she is in the NICU with her infant  ?- Strict return precautions reviewed should her symptoms worsen or if she develops any chest pain/shortness of breath associated with this  ?- Encouraged follow up with OB and Cardiology as scheduled  ?- Patient voiced understanding and is agreeable to plan; all questions and concerns addressed  ? ?Genia Del, MD ? ?

## 2022-02-12 ENCOUNTER — Encounter: Payer: Medicaid Other | Admitting: Family Medicine

## 2022-02-16 ENCOUNTER — Ambulatory Visit: Payer: Self-pay

## 2022-02-17 ENCOUNTER — Telehealth (HOSPITAL_COMMUNITY): Payer: Self-pay

## 2022-02-17 ENCOUNTER — Telehealth: Payer: Self-pay | Admitting: Licensed Clinical Social Worker

## 2022-02-17 NOTE — Telephone Encounter (Signed)
LCSW reached out to pt regarding appt on 3/16, inquired if pt had transportation to attend appt. Rides through [employer] will no longer be available in their current form starting 3/20. Await update from pt to provide further assistance as able. ? ?Octavio Graves, MSW, LCSW ?Clinical Social Worker II ?[facility]  ?[phone]- work cell phone (preferred) ?[phone]- desk phone ? ?

## 2022-02-17 NOTE — Telephone Encounter (Signed)
"  I'm good. I'm not bleeding much. Everything seems to be going fine. My baby is in the NICU. Baby is just working on feedings". Patient declines questions or concerns about her healing ? ?EPDS score is 6. ? ?Marcelino Duster Emelia Sandoval,RN3,MSN,RNC-MNN ?02/17/2022,1448 ?

## 2022-02-18 ENCOUNTER — Other Ambulatory Visit (HOSPITAL_COMMUNITY): Payer: Self-pay

## 2022-02-18 ENCOUNTER — Telehealth (HOSPITAL_COMMUNITY): Payer: Self-pay | Admitting: Pharmacist

## 2022-02-18 NOTE — Telephone Encounter (Signed)
Transitions of Care Pharmacy  ° °Call attempted for a pharmacy transitions of care follow-up. HIPAA appropriate voicemail was left with call back information provided.  ° °Call attempt #1. Will follow-up in 2-3 days.  °  °

## 2022-02-18 NOTE — Telephone Encounter (Signed)
Received the following text back from pt.  ?"I'm sorry I didn't see this until now. I am currently still int eh NICU with my baby. But I will be handling everything once he is discharged. Hopefully in a few days. I will let everyone know." ? ? ?LCSW responded "thank you for letting me know. Let us know if there is anything that we can assist with." ? ?Unclear if pt needs to reschedule her appt, but declines ride at this time. I gave brief verbal update to [place], [place] w/ Dr. Servando Salina. ?I remain available.  ? ?Octavio Graves, MSW, LCSW ?Clinical Social Worker II ?[facility]  ?[phone]- work cell phone (preferred) ?[phone]- desk phone ? ?

## 2022-02-19 ENCOUNTER — Other Ambulatory Visit (HOSPITAL_COMMUNITY): Payer: Self-pay

## 2022-02-19 ENCOUNTER — Ambulatory Visit: Payer: Medicaid Other

## 2022-02-19 NOTE — Progress Notes (Deleted)
Patient ID: [name]                 DOB: [birth year]                      MRN: [id number] ? ? ? ? ?HPI: ?[name] is a 36 y.o. female referred by Dr. Harriet Masson to HTN clinic. PMH is significant for ? ?Current HTN meds:  ?Previously tried:  ?BP goal:  ? ?Family History:  ? ?Social History:  ? ?Diet:  ? ?Exercise:  ? ?Home BP readings:  ? ?Wt Readings from Last 3 Encounters:  ?02/11/22 (!) 465 lb 1.6 oz (211 kg)  ?02/06/22 (!) 466 lb (211.4 kg)  ?01/30/22 (!) 471 lb (213.6 kg)  ? ?BP Readings from Last 3 Encounters:  ?02/11/22 (!) 148/67  ?02/09/22 (!) 146/80  ?01/30/22 (!) 168/80  ? ?Pulse Readings from Last 3 Encounters:  ?02/11/22 88  ?02/09/22 80  ?01/30/22 (!) 106  ? ? ?Renal function: ?Estimated Creatinine Clearance: 184.5 mL/min (by C-G formula based on SCr of 0.65 mg/dL). ? ?Past Medical History:  ?Diagnosis Date  ? Anxiety   ? Bipolar disorder ([place])   ? CHF (congestive heart failure) ([place])   ? Depression   ? Diabetes mellitus without complication ([place])   ? GERD (gastroesophageal reflux disease)   ? Heart failure ([place])   ? HPV (human papilloma virus) infection   ? Hypercholesteremia   ? Hypertension   ? Ovarian cyst   ? Vaginal Pap smear, abnormal   ? ? ?Current Outpatient Medications on File Prior to Visit  ?Medication Sig Dispense Refill  ? acetaminophen (TYLENOL) 325 MG tablet Take 2 tablets (650 mg total) by mouth every 4 (four) hours as needed (for pain scale < 4). 30 tablet 0  ? Cholecalciferol (VITAMIN D3) 50 MCG (2000 UT) CHEW Chew 2,000 Units by mouth every morning.    ? enoxaparin (LOVENOX) 100 MG/ML injection Inject 1 syringe (100 mg total) into the skin daily for 12 days. 12 mL 0  ? furosemide (LASIX) 20 MG tablet Take 1 tablet (20 mg total) by mouth daily. (Patient taking differently: Take 80 mg by mouth 2 (two) times daily.) 90 tablet 3  ? ibuprofen (ADVIL) 600 MG tablet Take 1 tablet (600 mg total) by mouth every 6 (six) hours as needed. 30 tablet 0  ? Insulin Disposable  Pump (OMNIPOD 5 G6 INTRO, GEN 5,) KIT by Does not apply route. Use with Novolog    ? Magnesium 200 MG TABS Take 200 mg by mouth 2 (two) times daily.    ? Nebivolol HCl 20 MG TABS Take 1 tablet (20 mg total) by mouth daily. 30 tablet 2  ? omeprazole (PRILOSEC) 40 MG capsule Take 40 mg by mouth every morning.    ? Prenatal MV & Min w/FA-DHA (PRENATAL GUMMIES PO) Take 1 tablet by mouth every morning.    ? ?No current facility-administered medications on file prior to visit.  ? ? ?Allergies  ?Allergen Reactions  ? Fish Allergy Anaphylaxis  ? Shellfish Allergy Anaphylaxis  ? ? ? ?Assessment/Plan: ? ?1. Hypertension -   ?

## 2022-02-20 ENCOUNTER — Other Ambulatory Visit: Payer: Self-pay

## 2022-02-20 ENCOUNTER — Ambulatory Visit: Payer: Medicaid Other

## 2022-03-09 ENCOUNTER — Ambulatory Visit: Payer: Self-pay | Admitting: Obstetrics & Gynecology

## 2022-03-09 NOTE — Progress Notes (Deleted)
? ? ?  Post Partum Visit Note ? ?[name] is a 36 y.o. [id number] female who presents for a postpartum visit. She is 4 weeks postpartum following a forceps delivery.  I have fully reviewed the prenatal and intrapartum course. The delivery was at 35.0 gestational weeks.  Anesthesia: epidural. Postpartum course has been ***. Baby is doing well***. Baby is feeding by bottle - {formula:72}. Bleeding {vag bleed:12292}. Bowel function is {normal:32111}. Bladder function is {normal:32111}. Patient {is/is not:9024} sexually active. Contraception method is IUD. Postpartum depression screening: {gen negative/positive:315881}. ? ? ?The pregnancy intention screening data noted above was reviewed. Potential methods of contraception were discussed. The patient elected to proceed with No data recorded. ? ? ? ?Health Maintenance Due  ?Topic Date Due  ? FOOT EXAM  Never done  ? OPHTHALMOLOGY EXAM  Never done  ? URINE MICROALBUMIN  Never done  ? Hepatitis C Screening  Never done  ? PAP SMEAR-Modifier  Never done  ? COVID-19 Vaccine (3 - Pfizer risk series) 01/16/2021  ? ? ?{Common ambulatory SmartLinks:19316} ? ?Review of Systems ?{ros; complete:30496} ? ?Objective:  ?LMP 06/04/2021   ? ?General:  {gen appearance:16600}  ? Breasts:  {desc; normal/abnormal/not indicated:14647}  ?Lungs: {lung exam:16931}  ?Heart:  {heart exam:5510}  ?Abdomen: {abdomen exam:16834}   ?Wound {Wound assessment:11097}  ?GU exam:  {desc; normal/abnormal/not indicated:14647}  ?     ?Assessment:  ? ? There are no diagnoses linked to this encounter. ? ?*** postpartum exam.  ? ?Plan:  ? ?Essential components of care per ACOG recommendations: ? ?1.  Mood and well being: Patient with {gen negative/positive:315881} depression screening today. Reviewed local resources for support.  ?- Patient tobacco use? {tobacco use:25506}  ?- hx of drug use? {yes/no:25505}   ? ?2. Infant care and feeding:  ?-Patient currently breastmilk feeding? {yes/no:25502}  ?-Social  determinants of health (SDOH) reviewed in EPIC. No concerns***The following needs were identified*** ? ?3. Sexuality, contraception and birth spacing ?- Patient {DOES_DOES NF:2365131 want a pregnancy in the next year.  Desired family size is {NUMBER 1-10:22536} children.  ?- Reviewed reproductive life planning. Reviewed contraceptive methods based on pt preferences and effectiveness.  Patient desired {Upstream End Methods:24109} today.   ?- Discussed birth spacing of 18 months ? ?4. Sleep and fatigue ?-Encouraged family/partner/community support of 4 hrs of uninterrupted sleep to help with mood and fatigue ? ?5. Physical Recovery  ?- Discussed patients delivery and complications. She describes her labor as {description:25511} ?- Patient had a {CHL AMB DELIVERY:[phone]}. Patient had a {laceration:25518} laceration. Perineal healing reviewed. Patient expressed understanding ?- Patient has urinary incontinence? {yes/no:25515} ?- Patient {ACTION; IS/IS VG:4697475 safe to resume physical and sexual activity ? ?6.  Health Maintenance ?- HM due items addressed {Yes or If no, why not?:20788} ?- Last pap smear No results found for: DIAGPAP Pap smear {done:10129} at today's visit.  ?-Breast Cancer screening indicated? {indicated:25516} ? ?7. Chronic Disease/Pregnancy Condition follow up: {Follow up:25499} ? ?- PCP follow up ? ?Georgia Lopes, RN ?Center for [facility] ? ?

## 2022-03-11 ENCOUNTER — Telehealth: Payer: Self-pay | Admitting: Licensed Clinical Social Worker

## 2022-03-11 NOTE — Telephone Encounter (Signed)
Reminder regarding Medicaid re-certification process was mailed to pt, encouraging her to ensure that an up to date phone number, address and email is registered with DSS so that she receives re-certification paperwork.  ?  ?Keyuna Cuthrell, MSW, LCSW ?Clinical Social Worker II ?[facility]  ?[phone]- work cell phone (preferred) ?[phone]- desk phone ?

## 2022-03-19 ENCOUNTER — Ambulatory Visit: Payer: Medicaid Other | Admitting: Cardiology

## 2022-03-19 ENCOUNTER — Telehealth: Payer: Self-pay

## 2022-03-19 ENCOUNTER — Ambulatory Visit: Payer: Medicaid Other

## 2022-03-19 NOTE — Telephone Encounter (Signed)
Called pt to check in on her since she missed her appointment with Pharmacy and Dr. Servando Salina this morning. No answer at this time. Left a message for her to return the call.  ?

## 2022-03-20 ENCOUNTER — Ambulatory Visit: Payer: Medicaid Other | Admitting: Cardiology

## 2022-03-23 ENCOUNTER — Encounter: Payer: Self-pay | Admitting: Cardiology

## 2022-04-06 ENCOUNTER — Ambulatory Visit: Payer: Medicaid Other | Admitting: Clinical

## 2022-04-06 ENCOUNTER — Ambulatory Visit (INDEPENDENT_AMBULATORY_CARE_PROVIDER_SITE_OTHER): Payer: Medicaid Other | Admitting: Obstetrics and Gynecology

## 2022-04-06 ENCOUNTER — Encounter: Payer: Self-pay | Admitting: Obstetrics and Gynecology

## 2022-04-06 VITALS — BP 127/91 | HR 128 | Ht 67.0 in | Wt >= 6400 oz

## 2022-04-06 DIAGNOSIS — I5032 Chronic diastolic (congestive) heart failure: Secondary | ICD-10-CM

## 2022-04-06 DIAGNOSIS — O24113 Pre-existing diabetes mellitus, type 2, in pregnancy, third trimester: Secondary | ICD-10-CM | POA: Diagnosis not present

## 2022-04-06 DIAGNOSIS — Z8659 Personal history of other mental and behavioral disorders: Secondary | ICD-10-CM

## 2022-04-06 DIAGNOSIS — F332 Major depressive disorder, recurrent severe without psychotic features: Secondary | ICD-10-CM | POA: Diagnosis not present

## 2022-04-06 DIAGNOSIS — Z975 Presence of (intrauterine) contraceptive device: Secondary | ICD-10-CM | POA: Diagnosis not present

## 2022-04-06 DIAGNOSIS — E1165 Type 2 diabetes mellitus with hyperglycemia: Secondary | ICD-10-CM

## 2022-04-06 NOTE — BH Specialist Note (Signed)
Integrated Behavioral Health via Telemedicine Visit ? ?04/09/2022 ?[name] ?[id number] ? ?Number of [facility] Clinician visits: 2- Second Visit ? ?Session Start time: [id number] ?  ?Session End time: [id number] ? ?Total time in minutes: 34 ? ? ?Referring Provider: Arlina Robes, MD ?Patient/Family location: Center for [facility] ? ?[facility] Provider location: Center for [employer] at [facility] ? ? ?All persons participating in visit: Patient [name] and [place]  ? ?Types of Service: Individual psychotherapy and Video visit ? ?I connected with Devonne Doughty and/or Marco Collie Dearcos's  n/a  via  Telephone or Video Enabled Telemedicine Application  (Video is Caregility application) and verified that I am speaking with the correct person using two identifiers. Discussed confidentiality: Yes  ? ?I discussed the limitations of telemedicine and the availability of in person appointments.  Discussed there is a possibility of technology failure and discussed alternative modes of communication if that failure occurs. ? ?I discussed that engaging in this telemedicine visit, they consent to the provision of behavioral healthcare and the services will be billed under their insurance. ? ?Patient and/or legal guardian expressed understanding and consented to Telemedicine visit: Yes  ? ?Presenting Concerns: ?Patient and/or family reports the following symptoms/concerns: Managing emotional wellness while adjusting to lack of quality sleep with colicky baby; feeling guilt , overwhelmed; feeling unheard by baby's pediatrician; taking medication as prescribed with no negative side effects and open to implementing self-coping strategy today. ?Duration of problem: Ongoing with increase postpartum; Severity of problem: severe ? ?Patient and/or Family's Strengths/Protective Factors: ?Concrete supports in place (healthy  food, safe environments, etc.) and Sense of purpose ? ?Goals Addressed: ?Patient will: ? Reduce symptoms of: anxiety, depression, insomnia, mood instability, and stress  ? Increase knowledge and/or ability of: coping skills and healthy habits  ? Demonstrate ability to: Increase healthy adjustment to current life circumstances and Increase adequate support systems for patient/family ? ?Progress towards Goals: ?Ongoing ? ?Interventions: ?Interventions utilized:  Optician, dispensing, Psychoeducation and/or Health Education, and Link to [employer] ?Standardized Assessments completed:  PHQ9/GAD7 given within two weeks ? ?Patient and/or Family Response: Patient agrees with treatment plan. ? ? ? ?Assessment: ?Patient currently experiencing Bipolar affective disorder (as previously diagnosed).  ? ?Patient may benefit from psychoeducation and brief therapeutic interventions regarding coping with symptoms of depression, anxiety, life stress. ? ?Plan: ?Follow up with behavioral health clinician on : Two weeks ?Behavioral recommendations:  ?-Continue taking Trintellix, Lamictal and Klonopin as prescribed ?-Continue advocating for baby's care with pediatrician (consider requesting second opinion by another provider at same practice). You may opt to change Medicaid plan and/or change pediatrician by calling number on back of insurance card.  ?-Accept all offers of help the next two weeks to prioritize goal of obtaining at least three hour stretch of sleep nightly. ?-CALM relaxation breathing exercise twice daily (morning; at bedtime); as needed throughout the day. ?-Consider new mom support groups available at either [email or web address] or [email or web address] ? ?Referral(s): Integrated Orthoptist (In Clinic) and [employer]:  new mom support ? ?I discussed the assessment and treatment plan with the patient and/or parent/guardian. They were provided an opportunity to ask questions  and all were answered. They agreed with the plan and demonstrated an understanding of the instructions. ?  ?They were advised to call back or seek an in-person evaluation if the symptoms worsen or if the condition fails to improve as anticipated. ? ?  Caroleen Hamman Elyce Zollinger, LCSW ? ? ?  01/30/2022  ?  8:00 AM 01/16/2022  ? 12:11 PM 12/31/2021  ?  1:37 PM  ?Depression screen PHQ 2/9  ?Decreased Interest 3 2 2   ?Down, Depressed, Hopeless 2 2 1   ?PHQ - 2 Score 5 4 3   ?Altered sleeping 3 3 2   ?Tired, decreased energy 3 3 3   ?Change in appetite 3 2 0  ?Feeling bad or failure about yourself  2 2 0  ?Trouble concentrating 2 2 1   ?Moving slowly or fidgety/restless 3 2 0  ?Suicidal thoughts 0 0 0  ?PHQ-9 Score 21 18 9   ? ? ?  01/30/2022  ?  8:00 AM 01/16/2022  ? 12:11 PM 12/31/2021  ?  1:37 PM  ?GAD 7 : Generalized Anxiety Score  ?Nervous, Anxious, on Edge 3 3 2   ?Control/stop worrying 3 3 2   ?Worry too much - different things 3 3 2   ?Trouble relaxing 3 3 2   ?Restless 3 3 2   ?Easily annoyed or irritable 3 3 2   ?Afraid - awful might happen 3 3 2   ?Total GAD 7 Score 21 21 14   ? ? ? ?

## 2022-04-06 NOTE — Progress Notes (Signed)
? ? ?Post Partum Visit Note ? ?[name] is a 36 y.o. [phone] female who presents for a postpartum visit. She is 8 weeks postpartum following a normal spontaneous vaginal delivery.  I have fully reviewed the prenatal and intrapartum course. The delivery was at 35.1 gestational weeks.  Anesthesia: epidural. Postpartum course has been uncomplicated. Baby is doing wellunremarkable. Baby is feeding by bottle - Gerber Soy . Bleeding  is currently heavy- IUD fell out last Wednesday . Bowel function is normal. Bladder function is normal. Patient is not sexually active. Contraception method is none. IUD fell out last week. Postpartum depression screening: negative. ? ? ?The pregnancy intention screening data noted above was reviewed. Potential methods of contraception were discussed. The patient elected to proceed with No data recorded. ? ? ? ?Health Maintenance Due  ?Topic Date Due  ? FOOT EXAM  Never done  ? OPHTHALMOLOGY EXAM  Never done  ? URINE MICROALBUMIN  Never done  ? Hepatitis C Screening  Never done  ? PAP SMEAR-Modifier  Never done  ? COVID-19 Vaccine (3 - Pfizer risk series) 01/16/2021  ? ? ?The following portions of the patient's history were reviewed and updated as appropriate: allergies, current medications, past family history, past medical history, past social history, past surgical history, and problem list. ? ?Review of Systems ?Pertinent items noted in HPI and remainder of comprehensive ROS otherwise negative. ? ?Objective:  ?LMP 06/04/2021   ? ?General:  alert  ? Breasts:  not indicated  ?Lungs: clear to auscultation bilaterally  ?Heart:  regular rate and rhythm, S1, S2 normal, no murmur, click, rub or gallop  ?Abdomen: soft, non-tender; bowel sounds normal; no masses,  no organomegaly   ?Wound NA  ?GU exam:  not indicated  ?     ?Assessment:  ? ? There are no diagnoses linked to this encounter. ? ?NL postpartum exam.  ?CHF ?CHTN ?DM ?Depression ? ?Plan:  ? ?Essential components of care  per ACOG recommendations: ? ?1.  Mood and well being: Patient with positive depression screening today. Reviewed local resources for support. [name] also saw pt. PCP already has on meds. Will continue and f/u with [name] and PCP ?- Patient tobacco use? No.   ?- hx of drug use? No.   ? ?2. Infant care and feeding:  ?-Patient currently breastmilk feeding? No.  ?-Social determinants of health (SDOH) reviewed in EPIC. No concerns ? ?3. Sexuality, contraception and birth spacing ?- Patient does not want a pregnancy in the next year.  Desired family size is completed   ?- Reviewed reproductive life planning. Reviewed contraceptive methods based on pt preferences and effectiveness. Pt had PP IUD placed, fell out last month. Desires replaced but on cycle today. Will return in 2 weeks for placement. Advised to reframe from intercourse until IUD placement ?4. Sleep and fatigue ?-Encouraged family/partner/community support of 4 hrs of uninterrupted sleep to help with mood and fatigue ? ?5. Physical Recovery  ?- Discussed patients delivery and complications. She describes her labor as mixed. ?- Patient had a Vaginal, no problems at delivery. Patient had a 2nd degree laceration. Perineal healing reviewed. Patient expressed understanding ?- Patient has urinary incontinence? No. ?- Patient is not safe to resume physical and sexual activity ? ?6.  Health Maintenance ?- HM due items addressed Yes ?- Last pap smear No results found for: DIAGPAP Pap smear not done at today's visit. Pt reports pap smear 1 yr ago normal except for HPV. ?-Breast Cancer screening indicated? No.  ? ?  7. Chronic Disease/Pregnancy Condition follow up. ?    VM to cardiology for F/U appt ?    PCP F/U as indicated ?    Endocrine F/U scheduled and pt is on cancellation list as well ? ? ?Nettie Elm, MD ?Center for [facility], [facility]  ?

## 2022-04-06 NOTE — BH Specialist Note (Signed)
Integrated Behavioral Health Initial In-Person Visit ? ?MRN: [id number] ?Name: [name] ? ?Number of Integrated Behavioral Health Clinician visits: 1- Initial Visit ? ?Session Start time: [phone] ?   ?Session End time: [id number] ? ?Total time in minutes: 10 ? ? ?Types of Service: Introduction only ? ?Interpretor:No. Interpretor Name and Language: n/a ? ? Warm Hand Off Completed. ? ?  ? ?  ? ? ?Rae Lips, LCSW ? ? ?  01/30/2022  ?  8:00 AM 01/16/2022  ? 12:11 PM 12/31/2021  ?  1:37 PM  ?Depression screen PHQ 2/9  ?Decreased Interest 3 2 2   ?Down, Depressed, Hopeless 2 2 1   ?PHQ - 2 Score 5 4 3   ?Altered sleeping 3 3 2   ?Tired, decreased energy 3 3 3   ?Change in appetite 3 2 0  ?Feeling bad or failure about yourself  2 2 0  ?Trouble concentrating 2 2 1   ?Moving slowly or fidgety/restless 3 2 0  ?Suicidal thoughts 0 0 0  ?PHQ-9 Score 21 18 9   ? ? ?  01/30/2022  ?  8:00 AM 01/16/2022  ? 12:11 PM 12/31/2021  ?  1:37 PM  ?GAD 7 : Generalized Anxiety Score  ?Nervous, Anxious, on Edge 3 3 2   ?Control/stop worrying 3 3 2   ?Worry too much - different things 3 3 2   ?Trouble relaxing 3 3 2   ?Restless 3 3 2   ?Easily annoyed or irritable 3 3 2   ?Afraid - awful might happen 3 3 2   ?Total GAD 7 Score 21 21 14   ? ? ? ? ? ? ? ? ? ?

## 2022-04-06 NOTE — Patient Instructions (Addendum)
Center for [employer] [facility] at [facility] ?[address] ?[place], [place] [address] ?[phone] (main office) ?[phone] (Keyvon Herter's office) ? ? ?Health Maintenance, Female ?Adopting a healthy lifestyle and getting preventive care are important in promoting health and wellness. Ask your health care provider about: ?The right schedule for you to have regular tests and exams. ?Things you can do on your own to prevent diseases and keep yourself healthy. ?What should I know about diet, weight, and exercise? ?Eat a healthy diet ? ?Eat a diet that includes plenty of vegetables, fruits, low-fat dairy products, and lean protein. ?Do not eat a lot of foods that are high in solid fats, added sugars, or sodium. ?Maintain a healthy weight ?Body mass index (BMI) is used to identify weight problems. It estimates body fat based on height and weight. Your health care provider can help determine your BMI and help you achieve or maintain a healthy weight. ?Get regular exercise ?Get regular exercise. This is one of the most important things you can do for your health. Most adults should: ?Exercise for at least 150 minutes each week. The exercise should increase your heart rate and make you sweat (moderate-intensity exercise). ?Do strengthening exercises at least twice a week. This is in addition to the moderate-intensity exercise. ?Spend less time sitting. Even light physical activity can be beneficial. ?Watch cholesterol and blood lipids ?Have your blood tested for lipids and cholesterol at 38 years of age, then have this test every 5 years. ?Have your cholesterol levels checked more often if: ?Your lipid or cholesterol levels are high. ?You are older than 38 years of age. ?You are at high risk for heart disease. ?What should I know about cancer screening? ?Depending on your health history and family history, you may need to have cancer screening at various ages. This may include screening for: ?Breast  cancer. ?Cervical cancer. ?Colorectal cancer. ?Skin cancer. ?Lung cancer. ?What should I know about heart disease, diabetes, and high blood pressure? ?Blood pressure and heart disease ?High blood pressure causes heart disease and increases the risk of stroke. This is more likely to develop in people who have high blood pressure readings or are overweight. ?Have your blood pressure checked: ?Every 3-5 years if you are 53-83 years of age. ?Every year if you are 20 years old or older. ?Diabetes ?Have regular diabetes screenings. This checks your fasting blood sugar level. Have the screening done: ?Once every three years after age 98 if you are at a normal weight and have a low risk for diabetes. ?More often and at a younger age if you are overweight or have a high risk for diabetes. ?What should I know about preventing infection? ?Hepatitis B ?If you have a higher risk for hepatitis B, you should be screened for this virus. Talk with your health care provider to find out if you are at risk for hepatitis B infection. ?Hepatitis C ?Testing is recommended for: ?Everyone born from [birth year] through 1967. ?Anyone with known risk factors for hepatitis C. ?Sexually transmitted infections (STIs) ?Get screened for STIs, including gonorrhea and chlamydia, if: ?You are sexually active and are younger than 38 years of age. ?You are older than 38 years of age and your health care provider tells you that you are at risk for this type of infection. ?Your sexual activity has changed since you were last screened, and you are at increased risk for chlamydia or gonorrhea. Ask your health care provider if you are at risk. ?Ask your health  care provider about whether you are at high risk for HIV. Your health care provider may recommend a prescription medicine to help prevent HIV infection. If you choose to take medicine to prevent HIV, you should first get tested for HIV. You should then be tested every 3 months for as long as you are taking  the medicine. ?Pregnancy ?If you are about to stop having your period (premenopausal) and you may become pregnant, seek counseling before you get pregnant. ?Take 400 to 800 micrograms (mcg) of folic acid every day if you become pregnant. ?Ask for birth control (contraception) if you want to prevent pregnancy. ?Osteoporosis and menopause ?Osteoporosis is a disease in which the bones lose minerals and strength with aging. This can result in bone fractures. If you are 35 years old or older, or if you are at risk for osteoporosis and fractures, ask your health care provider if you should: ?Be screened for bone loss. ?Take a calcium or vitamin D supplement to lower your risk of fractures. ?Be given hormone replacement therapy (HRT) to treat symptoms of menopause. ?Follow these instructions at home: ?Alcohol use ?Do not drink alcohol if: ?Your health care provider tells you not to drink. ?You are pregnant, may be pregnant, or are planning to become pregnant. ?If you drink alcohol: ?Limit how much you have to: ?0-1 drink a day. ?Know how much alcohol is in your drink. In the [place] one drink equals one 12 oz bottle of beer (355 mL), one 5 oz glass of wine (148 mL), or one 1? oz glass of hard liquor (44 mL). ?Lifestyle ?Do not use any products that contain nicotine or tobacco. These products include cigarettes, chewing tobacco, and vaping devices, such as e-cigarettes. If you need help quitting, ask your health care provider. ?Do not use street drugs. ?Do not share needles. ?Ask your health care provider for help if you need support or information about quitting drugs. ?General instructions ?Schedule regular health, dental, and eye exams. ?Stay current with your vaccines. ?Tell your health care provider if: ?You often feel depressed. ?You have ever been abused or do not feel safe at home. ?Summary ?Adopting a healthy lifestyle and getting preventive care are important in promoting health and wellness. ?Follow your health  care provider's instructions about healthy diet, exercising, and getting tested or screened for diseases. ?Follow your health care provider's instructions on monitoring your cholesterol and blood pressure. ?This information is not intended to replace advice given to you by your health care provider. Make sure you discuss any questions you have with your health care provider. ?Document Revised: 04/14/2021 Document Reviewed: 04/14/2021 ?Elsevier Patient Education ? 2023 Elsevier Inc. ? ? ?[facility]  ?[address], [place], [place] [address] [phone] or [phone] ?[facility] 24/7 FOR ANYONE ?[address], [place], [place]  [phone] ?Fax: [phone] [email or web address] ?*Interpreters available ?*Accepts all insurance and uninsured for Urgent Care needs ?*Accepts Medicaid and uninsured for outpatient treatment  ?  ? ?ONLY FOR [facility] ? ?New patient assessment and therapy walk-ins ?Mondays and Wednesdays 8am-11am ?First and second Fridays 1pm-5pm ? ?New patient psychiatry and medication management walk-ins:  ?Mondays, Wednesdays, Thursdays, Fridays 8am -11am ?NO PSYCHIATRY WALK-INS on TUESDAYS ? ? ?

## 2022-04-09 ENCOUNTER — Ambulatory Visit (INDEPENDENT_AMBULATORY_CARE_PROVIDER_SITE_OTHER): Payer: Medicaid Other | Admitting: Clinical

## 2022-04-09 DIAGNOSIS — F3160 Bipolar disorder, current episode mixed, unspecified: Secondary | ICD-10-CM | POA: Diagnosis not present

## 2022-04-09 NOTE — Patient Instructions (Signed)
[facility] at [facility] [address] [place], [place] [address] [phone] (main office) [phone] (Mahasin Riviere's office)  New Parent Support Groups [email or web address]   

## 2022-04-15 NOTE — BH Specialist Note (Signed)
Integrated Behavioral Health via Telemedicine Visit  04/23/2022 [name] [id number]  Number of Integrated Behavioral Health Clinician visits: 3- Third Visit  Session Start time: 1518   Session End time: 1541  Total time in minutes: 23   Referring Provider: Nettie Elm, MD Patient/Family location: [facility] Provider location: [facility]  All persons participating in visit: Patient [name] and [facility]   Types of Service: Individual psychotherapy and Telephone visit  I connected with [name] and/or [name]  n/a  via  Telephone or Video Enabled Telemedicine Application  (Video is Caregility application) and verified that I am speaking with the correct person using two identifiers. Discussed confidentiality: Yes   I discussed the limitations of telemedicine and the availability of in person appointments.  Discussed there is a possibility of technology failure and discussed alternative modes of communication if that failure occurs.  I discussed that engaging in this telemedicine visit, they consent to the provision of behavioral healthcare and the services will be billed under their insurance.  Patient and/or legal guardian expressed understanding and consented to Telemedicine visit: Yes   Presenting Concerns: Patient and/or family reports the following symptoms/concerns: Taking BH medication as prescribed, hasn't needed to take Klonopin in about a week; sleep improving to two 2.5 hour stretches nightly, baby is being treated for feeding issues (improving); using relaxation breathing exercises and walking baby in stroller outdoors helping to manage symptoms.  Duration of problem: Increase postpartum; Severity of problem: moderate  Patient and/or Family's Strengths/Protective Factors: Social connections, Concrete supports in place (healthy food, safe environments, etc.),  and Sense of purpose  Goals Addressed: Patient will:  Maintain reduction symptoms of: anxiety, depression, and stress   Progress towards Goals: Ongoing  Interventions: Interventions utilized:  Psychoeducation and/or Health Education and Supportive Reflection Standardized Assessments completed: GAD-7 and PHQ 9  Patient and/or Family Response: Patient agrees with treatment plan.   Assessment: Patient currently experiencing Bipolar affective disorder (as previously diagnosed).   Patient may benefit from brief therapeutic intervention today.  Plan: Follow up with behavioral health clinician on : Call [name] at [phone], as needed. Behavioral recommendations:  -Continue taking BH medication as prescribed and managed by PCP -Continue using daily self-coping strategies (relaxation breathing; outdoor walks with baby) as needed -Continue prioritizing healthy sleep; sleep when baby sleeps at night -Continue to consider new mom support group as needed at [email or web address].net Referral(s): Integrated [employer] (In Clinic)  I discussed the assessment and treatment plan with the patient and/or parent/guardian. They were provided an opportunity to ask questions and all were answered. They agreed with the plan and demonstrated an understanding of the instructions.   They were advised to call back or seek an in-person evaluation if the symptoms worsen or if the condition fails to improve as anticipated.  Amy Close Malayna Noori, LCSW     04/23/2022    3:28 PM 01/30/2022    8:00 AM 01/16/2022   12:11 PM 12/31/2021    1:37 PM  Depression screen PHQ 2/9  Decreased Interest 0 3 2 2   Down, Depressed, Hopeless 1 2 2 1   PHQ - 2 Score 1 5 4 3   Altered sleeping 0 3 3 2   Tired, decreased energy 1 3 3 3   Change in appetite 0 3 2 0  Feeling bad or failure about yourself  1 2 2  0  Trouble concentrating 0 2 2 1   Moving slowly or  fidgety/restless 0 3 2 0  Suicidal thoughts 0 0 0 0  PHQ-9  Score 3 21 18 9       04/23/2022    3:31 PM 01/30/2022    8:00 AM 01/16/2022   12:11 PM 12/31/2021    1:37 PM  GAD 7 : Generalized Anxiety Score  Nervous, Anxious, on Edge 1 3 3 2   Control/stop worrying 0 3 3 2   Worry too much - different things 1 3 3 2   Trouble relaxing 0 3 3 2   Restless 0 3 3 2   Easily annoyed or irritable 0 3 3 2   Afraid - awful might happen 0 3 3 2   Total GAD 7 Score 2 21 21  14

## 2022-04-20 ENCOUNTER — Ambulatory Visit: Payer: Medicaid Other | Admitting: Obstetrics & Gynecology

## 2022-04-20 ENCOUNTER — Telehealth: Payer: Self-pay | Admitting: Cardiology

## 2022-04-20 NOTE — Telephone Encounter (Signed)
Patient called in to triage to report chest pain/tightness under her breasts, dizziness on standing, nausea, and sweating. She rates the pain 8/10. She was crying. Recommended that she call 911. She wanted husband to take her to the ED, but I explained that EMS can provide treatment for the chest pain and she should be seen right away at the ED. She voiced understanding ?

## 2022-04-20 NOTE — Telephone Encounter (Signed)
Pt c/o of Chest Pain: STAT if CP now or developed within 24 hours ? ?1. Are you having CP right now? Yes ? ?2. Are you experiencing any other symptoms (ex. SOB, nausea, vomiting, sweating)? Swelling really bad in feet and nausea ? ?3. How long have you been experiencing CP? This morning ? ?4. Is your CP continuous or coming and going? Continuous  ? ?5. Have you taken Nitroglycerin? No  ??  ?

## 2022-04-23 ENCOUNTER — Ambulatory Visit (INDEPENDENT_AMBULATORY_CARE_PROVIDER_SITE_OTHER): Payer: Medicaid Other | Admitting: Clinical

## 2022-04-23 DIAGNOSIS — F3160 Bipolar disorder, current episode mixed, unspecified: Secondary | ICD-10-CM | POA: Diagnosis not present

## 2022-04-23 NOTE — Patient Instructions (Signed)
[facility] [address] [place], [place] [address] [phone] (main office) [phone] (Charles Niese's office)   

## 2022-05-07 ENCOUNTER — Ambulatory Visit: Payer: Medicaid Other | Admitting: Obstetrics and Gynecology

## 2022-06-19 ENCOUNTER — Ambulatory Visit: Payer: Medicaid Other | Admitting: Obstetrics and Gynecology

## 2022-07-30 ENCOUNTER — Ambulatory Visit: Payer: Medicaid Other | Admitting: Family Medicine

## 2022-10-06 ENCOUNTER — Ambulatory Visit: Payer: Medicaid Other | Admitting: Internal Medicine

## 2022-10-06 NOTE — Progress Notes (Deleted)
Name: [name]  MRN/ DOB: [id number], 11-Jul-1986   Age/ Sex: 36 y.o., female    PCP: Bonnita Nasuti, MD   Reason for Endocrinology Evaluation: Type 2 Diabetes Mellitus     Date of Initial Endocrinology Visit: 10/06/2022     PATIENT IDENTIFIER: [name] is a 36 y.o. female with a past medical history of T2DM, Bipolar d/o, and CHF. The patient presented for initial endocrinology clinic visit on 10/06/2022 for consultative assistance with her diabetes management.    HPI: [name] was    Diagnosed with DM *** Prior Medications tried/Intolerance: *** Currently checking blood sugars *** x / day,  before breakfast and ***.  Hypoglycemia episodes : ***               Symptoms: ***                 Frequency: ***/  Hemoglobin A1c has ranged from *** in ***, peaking at *** in ***. Patient required assistance for hypoglycemia:  Patient has required hospitalization within the last 1 year from hyper or hypoglycemia:   In terms of diet, the patient ***  She is S/P delivery 02/07/2022     HOME DIABETES REGIMEN: Basal: ***  Bolus: ***   Statin: {Yes/No:11203} ACE-I/ARB: {YES/NO:17245} Prior Diabetic Education: {Yes/No:11203}   METER DOWNLOAD SUMMARY: Date range evaluated: *** Fingerstick Blood Glucose Tests = *** Average Number Tests/Day = *** Overall Mean FS Glucose = *** Standard Deviation = ***  BG Ranges: Low = *** High = ***   Hypoglycemic Events/30 Days: BG < 50 = *** Episodes of symptomatic severe hypoglycemia = ***   DIABETIC COMPLICATIONS: Microvascular complications:  *** Denies: *** Last eye exam: Completed   Macrovascular complications:  *** Denies: CAD, PVD, CVA   PAST HISTORY: Past Medical History:  Past Medical History:  Diagnosis Date   Anxiety    Bipolar disorder ([place])    CHF (congestive heart failure) ([place])    Depression    Diabetes mellitus without complication (HCC)    GERD (gastroesophageal reflux disease)     Heart failure (HCC)    HPV (human papilloma virus) infection    Hypercholesteremia    Hypertension    Ovarian cyst    Vaginal Pap smear, abnormal    Past Surgical History:  Past Surgical History:  Procedure Laterality Date   CHOLECYSTECTOMY     SALPINGECTOMY Left    Benign growth    Social History:  reports that she has never smoked. She has never used smokeless tobacco. She reports that she does not drink alcohol and does not use drugs. Family History:  Family History  Problem Relation Age of Onset   Heart disease Mother    COPD Mother    Diabetes Mother    Depression Mother    Heart disease Son    Asthma Son      HOME MEDICATIONS: Allergies as of 10/06/2022       Reactions   Fish Allergy Anaphylaxis   Shellfish Allergy Anaphylaxis        Medication List        Accurate as of October 06, 2022 10:36 AM. If you have any questions, ask your nurse or doctor.          acetaminophen 325 MG tablet Commonly known as: Tylenol Take 2 tablets (650 mg total) by mouth every 4 (four) hours as needed (for pain scale < 4).   atorvastatin 20 MG tablet Commonly known as:  LIPITOR 1 tablet   clonazePAM 1 MG tablet Commonly known as: KLONOPIN 1 tablet as needed   furosemide 20 MG tablet Commonly known as: LASIX Take 1 tablet (20 mg total) by mouth daily. What changed:  how much to take when to take this   lamoTRIgine 25 MG Chew chewable tablet Commonly known as: LAMICTAL Chew 50 mg by mouth daily.   Nebivolol HCl 20 MG Tabs Take 1 tablet (20 mg total) by mouth daily.   omeprazole 40 MG capsule Commonly known as: PRILOSEC Take 40 mg by mouth every morning.   Omnipod 5 G6 Intro (Gen 5) Kit by Does not apply route. Use with Novolog   PRENATAL GUMMIES PO Take 1 tablet by mouth every morning.   Vitamin D3 50 MCG (2000 UT) Chew Chew 2,000 Units by mouth every morning.   vortioxetine HBr 20 MG Tabs tablet Commonly known as: TRINTELLIX 1 tablet          ALLERGIES: Allergies  Allergen Reactions   Fish Allergy Anaphylaxis   Shellfish Allergy Anaphylaxis     REVIEW OF SYSTEMS: A comprehensive ROS was conducted with the patient and is negative except as per HPI and below:  ROS    OBJECTIVE:   VITAL SIGNS: There were no vitals taken for this visit.   PHYSICAL EXAM:  General: Pt appears well and is in NAD  Neck: General: Supple without adenopathy or carotid bruits. Thyroid: Thyroid size normal.  No goiter or nodules appreciated.   Lungs: Clear with good BS bilat with no rales, rhonchi, or wheezes  Heart: RRR   Abdomen:  soft, nontender  Extremities:  Lower extremities - No pretibial edema. No lesions.  Skin: Normal texture and temperature to palpation. No rash noted.  Neuro: MS is good with appropriate affect, pt is alert and Ox3    DM foot exam:    DATA REVIEWED:  Lab Results  Component Value Date   HGBA1C 7.2 (H) 12/31/2021   HGBA1C 9.3 (H) 05/24/2017   Lab Results  Component Value Date   LDLCALC UNABLE TO CALCULATE IF TRIGLYCERIDE OVER 400 mg/dL 05/24/2017   CREATININE 0.65 02/08/2022   No results found for: "MICRALBCREAT"  Lab Results  Component Value Date   CHOL 156 05/24/2017   HDL 31 (L) 05/24/2017   LDLCALC UNABLE TO CALCULATE IF TRIGLYCERIDE OVER 400 mg/dL 05/24/2017   TRIG 408 (H) 05/24/2017   CHOLHDL 5.0 05/24/2017        ASSESSMENT / PLAN / RECOMMENDATIONS:   1) Type *** Diabetes Mellitus, ***controlled, With*** complications - Most recent A1c of *** %. Goal A1c < *** %.  ***  Plan: GENERAL: ***  MEDICATIONS: ***  EDUCATION / INSTRUCTIONS: BG monitoring instructions: Patient is instructed to check her blood sugars *** times a day, ***. Call [facility] if: BG persistently < 70  I reviewed the Rule of 15 for the treatment of hypoglycemia in detail with the patient. Literature supplied.   2) Diabetic complications:  Eye: Does *** have known diabetic retinopathy.   Neuro/ Feet: Does *** have known diabetic peripheral neuropathy. Renal: Patient does *** have known baseline CKD. She is *** on an ACEI/ARB at present.  3) Lipids: Patient is *** on a statin.    4) Hypertension: ***  at goal of < 140/90 mmHg.       Signed electronically by: Mack Guise, MD  [facility]  [facility] [address] [place], [place] [address] Phone:  [phone] FAX: [phone]   CC: Bonnita Nasuti, MD [address] [place] [address] Phone: [phone]  Fax: [phone]    Return to Endocrinology clinic as below: Future Appointments  Date Time Provider [place]  10/06/2022  1:00 PM Shamleffer, Melanie Crazier, MD [facility] None

## 2023-02-09 IMAGING — DX DG CHEST 1V PORT
1 series · 1 of 1 positions shown · non-contrast
Comparison: 10/21/2021

CLINICAL DATA: Shortness of breath, chest pain, pregnant

EXAM:
PORTABLE CHEST 1 VIEW

[chest ap]
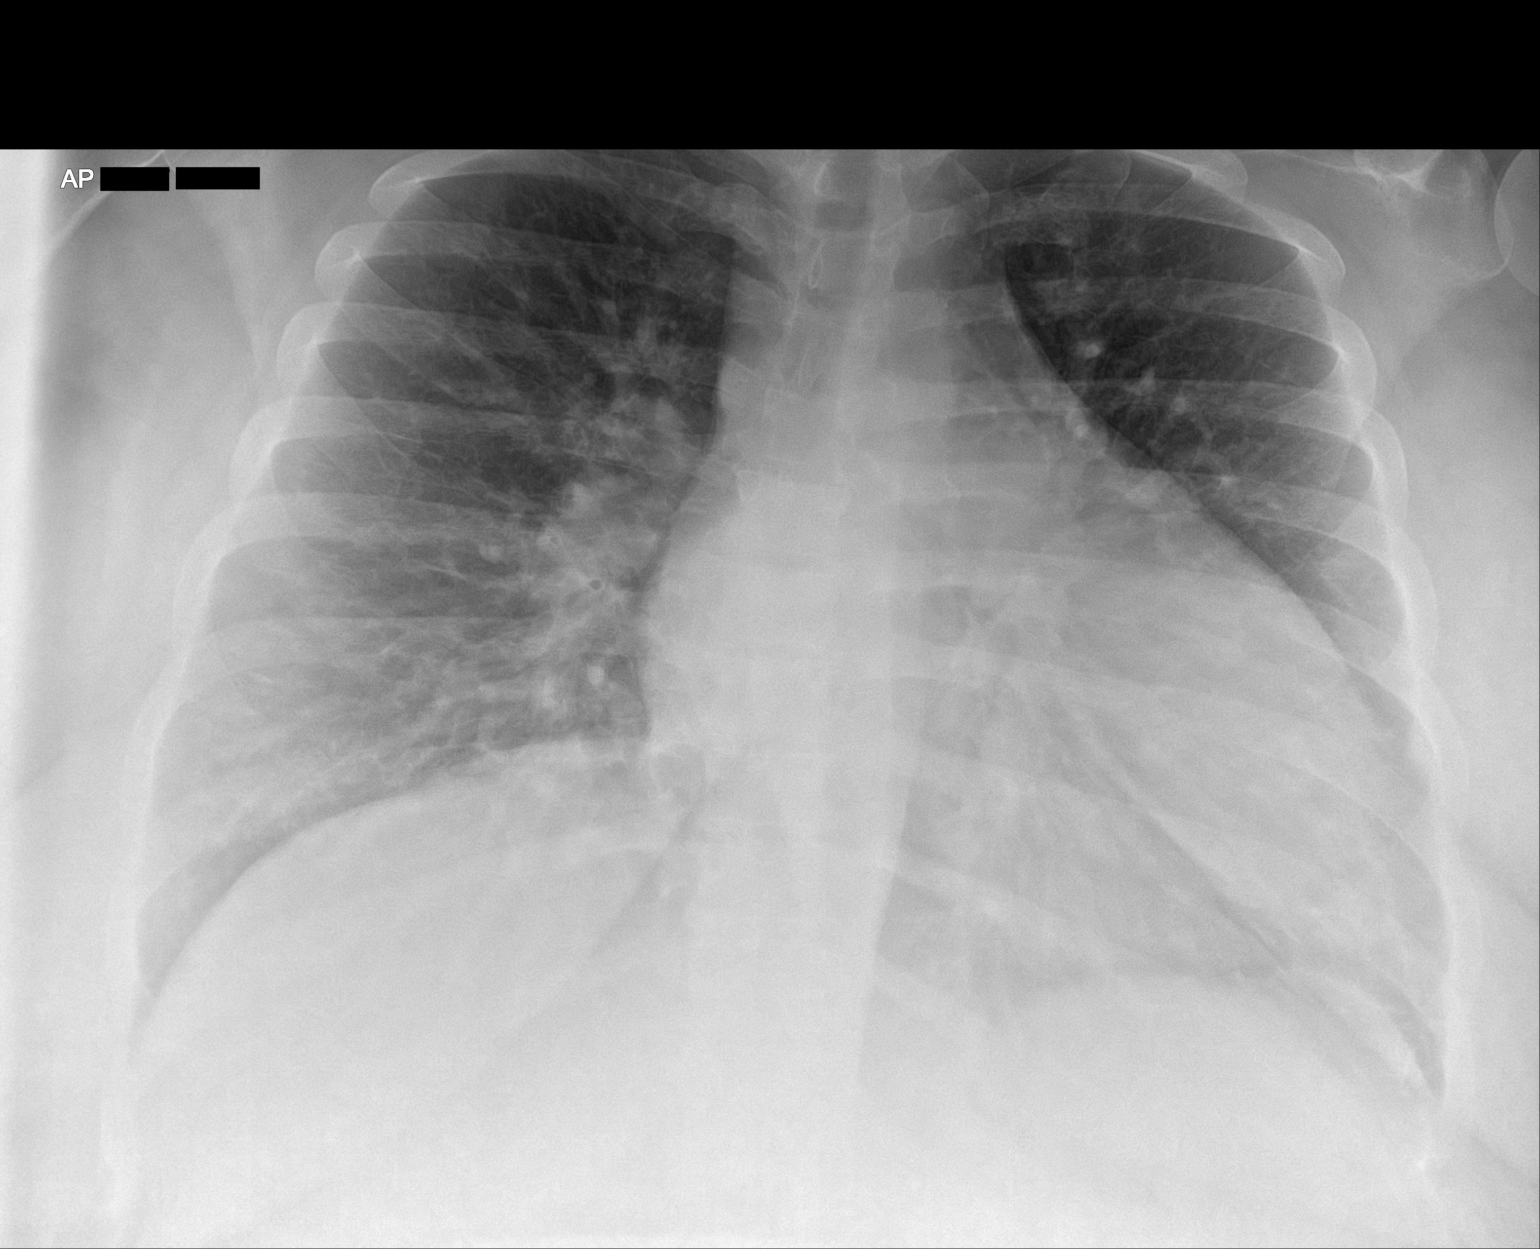

[1 of 1 positions shown; findings below may reference images not displayed]

FINDINGS: Mild interstitial prominence particularly at the right lung base.
Top-normal heart size. No pleural effusion or pneumothorax.
IMPRESSION: Mild interstitial prominence particularly at the right lung base may
reflect edema.

## 2024-12-26 ENCOUNTER — Telehealth: Payer: Self-pay | Admitting: Cardiology

## 2024-12-26 NOTE — Telephone Encounter (Signed)
 Pt requesting a provider switch to Dr. Edwyna being that the [facility] office is closer. Please advsie

## 2024-12-28 ENCOUNTER — Other Ambulatory Visit: Payer: Self-pay

## 2024-12-28 DIAGNOSIS — B977 Papillomavirus as the cause of diseases classified elsewhere: Secondary | ICD-10-CM | POA: Insufficient documentation

## 2024-12-28 DIAGNOSIS — I509 Heart failure, unspecified: Secondary | ICD-10-CM | POA: Insufficient documentation

## 2024-12-28 DIAGNOSIS — K219 Gastro-esophageal reflux disease without esophagitis: Secondary | ICD-10-CM | POA: Insufficient documentation

## 2024-12-28 DIAGNOSIS — I10 Essential (primary) hypertension: Secondary | ICD-10-CM | POA: Insufficient documentation

## 2024-12-28 DIAGNOSIS — F319 Bipolar disorder, unspecified: Secondary | ICD-10-CM | POA: Insufficient documentation

## 2024-12-28 DIAGNOSIS — R87629 Unspecified abnormal cytological findings in specimens from vagina: Secondary | ICD-10-CM | POA: Insufficient documentation

## 2025-01-03 ENCOUNTER — Ambulatory Visit: Admitting: Cardiology

## 2025-01-11 ENCOUNTER — Ambulatory Visit: Admitting: Cardiology

## 2025-01-16 ENCOUNTER — Ambulatory Visit: Admitting: Cardiology
